# Patient Record
Sex: Female | Born: 1965 | Hispanic: Yes | Marital: Single | Smoking: Former smoker
Health system: Southern US, Community
[De-identification: ages and names within clinical notes are randomized; demographics above are authoritative.]

## PROBLEM LIST (undated history)

## (undated) DIAGNOSIS — I10 Essential (primary) hypertension: Secondary | ICD-10-CM

---

## 2014-09-21 ENCOUNTER — Encounter (HOSPITAL_BASED_OUTPATIENT_CLINIC_OR_DEPARTMENT_OTHER): Payer: Self-pay | Admitting: *Deleted

## 2014-09-21 ENCOUNTER — Emergency Department (HOSPITAL_BASED_OUTPATIENT_CLINIC_OR_DEPARTMENT_OTHER): Payer: Self-pay

## 2014-09-21 ENCOUNTER — Emergency Department (HOSPITAL_BASED_OUTPATIENT_CLINIC_OR_DEPARTMENT_OTHER)
Admission: EM | Admit: 2014-09-21 | Discharge: 2014-09-21 | Disposition: A | Discharge status: Home / Self Care | Payer: Self-pay | Attending: Emergency Medicine | Admitting: Emergency Medicine

## 2014-09-21 DIAGNOSIS — R200 Anesthesia of skin: Secondary | ICD-10-CM | POA: Insufficient documentation

## 2014-09-21 DIAGNOSIS — R0789 Other chest pain: Secondary | ICD-10-CM | POA: Insufficient documentation

## 2014-09-21 DIAGNOSIS — R0602 Shortness of breath: Secondary | ICD-10-CM | POA: Insufficient documentation

## 2014-09-21 DIAGNOSIS — I10 Essential (primary) hypertension: Secondary | ICD-10-CM | POA: Insufficient documentation

## 2014-09-21 DIAGNOSIS — R110 Nausea: Secondary | ICD-10-CM | POA: Insufficient documentation

## 2014-09-21 DIAGNOSIS — Z720 Tobacco use: Secondary | ICD-10-CM | POA: Insufficient documentation

## 2014-09-21 HISTORY — DX: Essential (primary) hypertension: I10

## 2014-09-21 LAB — BASIC METABOLIC PANEL
Anion gap: 8 (ref 5–15)
BUN: 10 mg/dL (ref 6–20)
CO2: 24 mmol/L (ref 22–32)
CREATININE: 0.5 mg/dL (ref 0.44–1.00)
Calcium: 8.7 mg/dL — ABNORMAL LOW (ref 8.9–10.3)
Chloride: 105 mmol/L (ref 101–111)
Glucose, Bld: 105 mg/dL — ABNORMAL HIGH (ref 65–99)
POTASSIUM: 3.4 mmol/L — AB (ref 3.5–5.1)
SODIUM: 137 mmol/L (ref 135–145)

## 2014-09-21 LAB — TROPONIN I
Troponin I: <0.03 ng/mL (ref <0.031)
Troponin I: <0.03 ng/mL (ref <0.031)

## 2014-09-21 LAB — CBC
HEMATOCRIT: 36.2 % (ref 36.0–46.0)
Hemoglobin: 12.2 g/dL (ref 12.0–15.0)
MCH: 27.1 pg (ref 26.0–34.0)
MCHC: 33.7 g/dL (ref 30.0–36.0)
MCV: 80.3 fL (ref 78.0–100.0)
Platelets: 244 10*3/uL (ref 150–400)
RBC: 4.51 MIL/uL (ref 3.87–5.11)
RDW: 16 % — ABNORMAL HIGH (ref 11.5–15.5)
WBC: 8.3 10*3/uL (ref 4.0–10.5)

## 2014-09-21 MED ORDER — POTASSIUM CHLORIDE CRYS ER 20 MEQ PO TBCR
40.0000 meq | EXTENDED_RELEASE_TABLET | Freq: Once | ORAL | Status: AC
  Administered 2014-09-21: 40 meq via ORAL
  Filled 2014-09-21: qty 2

## 2014-09-21 NOTE — ED Provider Notes (Signed)
CSN: [id number]     Arrival date & time 09/21/14  1428 History   First MD Initiated Contact with Patient 09/21/14 1436     Chief Complaint  Patient presents with  . Chest Pain   History is obtained from professional medical interpreter using language line. Patient speaks no English  (Consider location/radiation/quality/duration/timing/severity/associated sxs/prior Treatment) HPI Complains of chest pain intermittent for the past 2 weeks accompanied by left arm and left leg numbness. Chest pain is made worse by lying on her right side improved with lifting small weights. She reports feeling short of breath and nauseated last night however now she is asymptomatic. Chest pain is nonexertional. Sharp in quality. At left anterior chest. Past Medical History  Diagnosis Date  . Hypertension     History reviewed. No pertinent past surgical history. History reviewed. No pertinent family history. History  Substance Use Topics  . Smoking status: Current Some Day Smoker  . Smokeless tobacco: Not on file  . Alcohol Use: No   denies drug use OB History    No data available     family history negative for coronary disease Review of Systems  Constitutional: Negative.   HENT: Negative.   Respiratory: Positive for shortness of breath.   Cardiovascular: Positive for chest pain.  Gastrointestinal: Positive for nausea.  Genitourinary: Negative.        Last normal menstrual period past 2 weeks ago  Musculoskeletal: Negative.   Skin: Negative.   Neurological: Positive for numbness.  Psychiatric/Behavioral: Negative.   All other systems reviewed and are negative.     Allergies  Review of patient's allergies indicates no known allergies.  Home Medications  Medication for weight loss, Prior to Admission medications   Not on File   There were no vitals taken for this visit. Physical Exam  Constitutional: She appears well-developed and well-nourished.  HENT:  Head: Normocephalic and  atraumatic.  Eyes: Conjunctivae are normal. Pupils are equal, round, and reactive to light.  Neck: Neck supple. No tracheal deviation present. No thyromegaly present.  Cardiovascular: Normal rate and regular rhythm.   No murmur heard. Pulmonary/Chest: Effort normal and breath sounds normal.  Abdominal: Soft. Bowel sounds are normal. She exhibits no distension. There is no tenderness.  Musculoskeletal: Normal range of motion. She exhibits no edema or tenderness.  Neurological: She is alert. Coordination normal.  Skin: Skin is warm and dry. No rash noted.  Psychiatric: She has a normal mood and affect.  Nursing note and vitals reviewed.   ED Course  Procedures (including critical care time) Labs Review Labs Reviewed  BASIC METABOLIC PANEL  CBC  TROPONIN I    Imaging Review No results found.   EKG Interpretation None      Date: 09/21/2014  Rate: 70  Rhythm: normal sinus rhythm  QRS Axis: normal  Intervals: normal  ST/T Wave abnormalities: normal  Conduction Disutrbances: none  Narrative Interpretation: unremarkable     Chest x-ray viewed by me  4:20 PM patient remains asymptomatic. Results for orders placed or performed during the hospital encounter of 09/21/14  Basic metabolic panel  Result Value Ref Range   Sodium 137 135 - 145 mmol/L   Potassium 3.4 (L) 3.5 - 5.1 mmol/L   Chloride 105 101 - 111 mmol/L   CO2 24 22 - 32 mmol/L   Glucose, Bld 105 (H) 65 - 99 mg/dL   BUN 10 6 - 20 mg/dL   Creatinine, Ser 7.84 0.44 - 1.00 mg/dL   Calcium 8.7 (L) 8.9 -  10.3 mg/dL   GFR calc non Af Amer >60 >60 mL/min   GFR calc Af Amer >60 >60 mL/min   Anion gap 8 5 - 15  CBC  Result Value Ref Range   WBC 8.3 4.0 - 10.5 K/uL   RBC 4.51 3.87 - 5.11 MIL/uL   Hemoglobin 12.2 12.0 - 15.0 g/dL   HCT 69.6 29.5 - 28.4 %   MCV 80.3 78.0 - 100.0 fL   MCH 27.1 26.0 - 34.0 pg   MCHC 33.7 30.0 - 36.0 g/dL   RDW 13.2 (H) 44.0 - 10.2 %   Platelets 244 150 - 400 K/uL  Troponin I   Result Value Ref Range   Troponin I <0.03 <0.031 ng/mL   Dg Chest 2 View  09/21/2014   CLINICAL DATA:  Left arm tingling for 2 weeks. Left-sided chest pain since yesterday. Shortness of breath.  EXAM: CHEST  2 VIEW  COMPARISON:  None.  FINDINGS: The heart size and mediastinal contours are within normal limits. Both lungs are clear. The visualized skeletal structures are unremarkable.  IMPRESSION: No active cardiopulmonary disease.   Electronically Signed   By: Annia Belt M.D.   On: 09/21/2014 15:25    MDM  Patient signed out to Dr. Rubin Payor for 425 PM. Assuming patient's second troponin negative, heart score equals 2. Patient with 2 cardiac risk factors. Highly atypical story. Normal EKG. Final diagnoses:  None   I counseled patient for 5 minutes on smoking cessation. She'll be referred to [facility] Diagnoses #1 atypical chest pain #2 tobacco abuse #3 hypokalemia     Doug Sou, MD 09/21/14 7253

## 2014-09-21 NOTE — ED Notes (Signed)
Patient c/o L arm tingling for the past two weeks, face started tingling last night, L side chest pain started yesterday with SOB

## 2014-09-21 NOTE — ED Provider Notes (Signed)
  Physical Exam  BP 105/65 mmHg  Pulse 61  Temp(Src) 97.8 F (36.6 C) (Oral)  Resp 20  Ht  (1.626 m)  Wt 180 lb (81.647 kg)  BMI 30.88 kg/m2  SpO2 99%  LMP 09/09/2014  Physical Exam  ED Course  Procedures  MDM Second troponin negative. Will discharge home.      Benjiman Core, MD 09/21/14 [phone]

## 2014-09-21 NOTE — Discharge Instructions (Signed)
Dolor de pecho (no especfico) (Chest Pain (Nonspecific)) Call the [facility] community wellness [facility] to get established with a primary care physician. Ask your new primary care physician to help you to stop smoking. Suele ser difcil diagnosticar la causa del dolor de [place]. Siempre hay una posibilidad de que el dolor podra estar relacionado con algo grave, como un ataque al corazn o un cogulo sanguneo en los pulmones. Debe concurrir a las visitas de control con el mdico. CUIDADOS EN EL HOGAR  Si le dieron antibiticos, tmelos como se lo haya indicado el mdico. Finalice el medicamento, aunque comience a Actor.  [address], no haga actividades que provoquen dolor de [place]. Contine con las actividades fsicas como se lo haya indicado el mdico.  No use productos que contengan tabaco, que incluyen cigarrillos, tabaco para Theatre manager y Administrator, Civil Service.  Evite el consumo de alcohol.  Tome los medicamentos solamente como se lo haya indicado el mdico.  Siga las sugerencias del mdico en lo que respecta a ms pruebas, si el dolor de pecho no desaparece.  Concurra a todas las visitas que concert con el mdico. SOLICITE AYUDA SI:  El dolor de pecho no desaparece, incluso despus del tratamiento.  Tiene una erupcin cutnea con ampollas en el pecho.  Tiene fiebre. SOLICITE AYUDA DE INMEDIATO SI:   Aumenta el dolor de pecho o el dolor se irradia hacia el brazo, el cuello, la [place], la espalda o el vientre (abdomen).  Le falta el aire.  Tose ms de lo normal o tose con sangre.  Siente un dolor muy intenso en la espalda o el vientre.  Tiene malestar estomacal (nuseas) o vomita.  Se siente muy dbil.  Pierde el conocimiento (se desmaya).  Tiene escalofros. Esto es Radio broadcast assistant. No espere a ver que los problemas desaparezcan. Llame a los servicios de emergencia locales (911 en los [place]). No conduzca por sus propios medios  [facility]. ASEGRESE DE QUE:   Comprende estas instrucciones.  Controlar su afeccin.  Recibir ayuda de inmediato si no mejora o si empeora. Document Released: 04/29/2008 Document Revised: 02/05/2013 [facility] Patient Information 2015 [place], [place]. This information is not intended to replace advice given to you by your health care provider. Make sure you discuss any questions you have with your health care provider.

## 2015-11-09 ENCOUNTER — Emergency Department (HOSPITAL_BASED_OUTPATIENT_CLINIC_OR_DEPARTMENT_OTHER)
Admission: EM | Admit: 2015-11-09 | Discharge: 2015-11-09 | Disposition: A | Discharge status: Home / Self Care | Payer: Self-pay | Attending: Emergency Medicine | Admitting: Emergency Medicine

## 2015-11-09 ENCOUNTER — Encounter (HOSPITAL_BASED_OUTPATIENT_CLINIC_OR_DEPARTMENT_OTHER): Payer: Self-pay | Admitting: *Deleted

## 2015-11-09 ENCOUNTER — Emergency Department (HOSPITAL_BASED_OUTPATIENT_CLINIC_OR_DEPARTMENT_OTHER): Payer: Self-pay

## 2015-11-09 DIAGNOSIS — F17210 Nicotine dependence, cigarettes, uncomplicated: Secondary | ICD-10-CM | POA: Insufficient documentation

## 2015-11-09 DIAGNOSIS — R42 Dizziness and giddiness: Secondary | ICD-10-CM | POA: Insufficient documentation

## 2015-11-09 DIAGNOSIS — R0602 Shortness of breath: Secondary | ICD-10-CM | POA: Insufficient documentation

## 2015-11-09 DIAGNOSIS — I10 Essential (primary) hypertension: Secondary | ICD-10-CM | POA: Insufficient documentation

## 2015-11-09 LAB — CBC
HCT: 38.9 % (ref 36.0–46.0)
Hemoglobin: 13.1 g/dL (ref 12.0–15.0)
MCH: 27.8 pg (ref 26.0–34.0)
MCHC: 33.7 g/dL (ref 30.0–36.0)
MCV: 82.4 fL (ref 78.0–100.0)
PLATELETS: 257 10*3/uL (ref 150–400)
RBC: 4.72 MIL/uL (ref 3.87–5.11)
RDW: 13.4 % (ref 11.5–15.5)
WBC: 6.6 10*3/uL (ref 4.0–10.5)

## 2015-11-09 LAB — COMPREHENSIVE METABOLIC PANEL
ALBUMIN: 4.2 g/dL (ref 3.5–5.0)
ALK PHOS: 87 U/L (ref 38–126)
ALT: 16 U/L (ref 14–54)
AST: 16 U/L (ref 15–41)
Anion gap: 8 (ref 5–15)
BUN: 13 mg/dL (ref 6–20)
CALCIUM: 9 mg/dL (ref 8.9–10.3)
CO2: 23 mmol/L (ref 22–32)
Chloride: 106 mmol/L (ref 101–111)
Creatinine, Ser: 0.58 mg/dL (ref 0.44–1.00)
GFR calc Af Amer: >60 mL/min (ref >60)
GFR calc non Af Amer: >60 mL/min (ref >60)
GLUCOSE: 97 mg/dL (ref 65–99)
Potassium: 3.5 mmol/L (ref 3.5–5.1)
SODIUM: 137 mmol/L (ref 135–145)
Total Bilirubin: 0.6 mg/dL (ref 0.3–1.2)
Total Protein: 7 g/dL (ref 6.5–8.1)

## 2015-11-09 LAB — URINALYSIS, ROUTINE W REFLEX MICROSCOPIC
BILIRUBIN URINE: NEGATIVE
Glucose, UA: NEGATIVE mg/dL
HGB URINE DIPSTICK: NEGATIVE
KETONES UR: NEGATIVE mg/dL
Leukocytes, UA: NEGATIVE
Nitrite: NEGATIVE
PROTEIN: NEGATIVE mg/dL
Specific Gravity, Urine: 1.009 (ref 1.005–1.030)
pH: 5.5 (ref 5.0–8.0)

## 2015-11-09 LAB — TROPONIN I

## 2015-11-09 LAB — LIPASE, BLOOD: Lipase: 44 U/L (ref 11–51)

## 2015-11-09 MED ORDER — IOPAMIDOL (ISOVUE-370) INJECTION 76%
100.0000 mL | Freq: Once | INTRAVENOUS | Status: AC | PRN
  Administered 2015-11-09: 100 mL via INTRAVENOUS

## 2015-11-09 MED ORDER — MECLIZINE HCL 25 MG PO TABS: 25.0000 mg | ORAL_TABLET | Freq: Three times a day (TID) | ORAL | 0 refills | Status: DC | PRN

## 2015-11-09 MED ORDER — MECLIZINE HCL 25 MG PO TABS
25.0000 mg | ORAL_TABLET | Freq: Once | ORAL | Status: AC
  Administered 2015-11-09: 25 mg via ORAL
  Filled 2015-11-09: qty 1

## 2015-11-09 NOTE — ED Triage Notes (Signed)
Limited english.  Pt reports multiple complaints.  Abdominal pain with N/V, SOB, dizziness, lightheaded x 1 week.  Denies diarrhea.

## 2015-11-09 NOTE — ED Provider Notes (Signed)
[facility]-EMERGENCY DEPT Provider Note   CSN: [id number] Arrival date & time: 11/09/15  1234     History   Chief Complaint Chief Complaint  Patient presents with  . Shortness of Breath    HPI Kaitlin Alvarez is a 50 y.o. female with no significant past history who presents emergency Department with chief complaint of pleuritic chest pain. Patient was noticed that she states has swelling in the left leg for about one month. She is to be worse at night. She denies any pain, redness. She has never had that previously. One week ago she developed pain in the left chest which she describes as pleuritic, nonexertional, constant with associated shortness of breath. She denies hemoptysis, history of DVT, oral contraceptive use or recent confinement. She does smoke cigarettes. She denies fevers or chills.  HPI  Past Medical History:  Diagnosis Date  . Hypertension     There are no active problems to display for this patient.   History reviewed. No pertinent surgical history.  OB History    No data available       Home Medications    Prior to Admission medications   Medication Sig Start Date End Date Taking? Authorizing Provider  meclizine (ANTIVERT) 25 MG tablet Take 1 tablet (25 mg total) by mouth 3 (three) times daily as needed for dizziness. 11/09/15   Eyvonne MechanicJeffrey Hedges, PA-C    Family History History reviewed. No pertinent family history.  Social History Social History  Substance Use Topics  . Smoking status: Current Some Day Smoker    Packs/day: 0.50    Types: Cigarettes  . Smokeless tobacco: Never Used  . Alcohol use No   heart score equals 2. Patient with 2 cardiac risk factors. Highly atypical story. Normal EKG.   Allergies   Review of patient's allergies indicates no known allergies.   Review of Systems Review of Systems  Ten systems reviewed and are negative for acute change, except as noted in the HPI.   Physical Exam Updated Vital Signs BP 125/86   Pulse  65   Temp 98 F (36.7 C) (Oral)   Resp 20   Ht 5\' 4"  (1.626 m)   Wt 79.4 kg   LMP 10/09/2015   SpO2 99%   BMI 30.04 kg/m   Physical Exam  Constitutional: She is oriented to person, place, and time. She appears well-developed and well-nourished. No distress.  HENT:  Head: Normocephalic and atraumatic.  Eyes: Conjunctivae are normal. No scleral icterus.  Neck: Normal range of motion.  Cardiovascular: Normal rate, regular rhythm, normal heart sounds and intact distal pulses.  Exam reveals no gallop and no friction rub.   No murmur heard. No appreciable swelling in one leg versus the other Significant telangiectasia of the bilateral lower extremities  Pulmonary/Chest: Effort normal and breath sounds normal. No respiratory distress. She exhibits no tenderness.  Abdominal: Soft. Bowel sounds are normal. She exhibits no distension and no mass. There is no tenderness. There is no guarding.  Neurological: She is alert and oriented to person, place, and time.  Skin: Skin is warm and dry. She is not diaphoretic.  Nursing note and vitals reviewed.    ED Treatments / Results  Labs (all labs ordered are listed, but only abnormal results are displayed) Labs Reviewed  LIPASE, BLOOD  COMPREHENSIVE METABOLIC PANEL  CBC  URINALYSIS, ROUTINE W REFLEX MICROSCOPIC (NOT [facility])  TROPONIN I    EKG  EKG Interpretation  Date/Time:  Monday November 09 2015 13:07:09 EDT  Ventricular Rate:  87 PR Interval:  160 QRS Duration: 84 QT Interval:  376 QTC Calculation: 452 R Axis:   127 Text Interpretation:  Normal sinus rhythm No significant change since last tracing Confirmed by Erroll Luna [phone]) on 11/09/2015 11:51:06 PM       Radiology Ct Angio Chest Pe W And/or Wo Contrast  Result Date: 11/09/2015 CLINICAL DATA:  Cough, shortness of breath for 1 week EXAM: CT ANGIOGRAPHY CHEST WITH CONTRAST TECHNIQUE: Multidetector CT imaging of the chest was performed using the standard  protocol during bolus administration of intravenous contrast. Multiplanar CT image reconstructions and MIPs were obtained to evaluate the vascular anatomy. CONTRAST:  100 cc Isovue COMPARISON:  None. FINDINGS: Cardiovascular: The study is of excellent technical quality. No pulmonary embolus is noted. There is no aortic aneurysm or aortic dissection. Heart size within normal limits. No pericardial effusion. Mediastinum/Nodes: No mediastinal hematoma or adenopathy. Lungs/Pleura: Images of the lung parenchyma shows no acute infiltrate or pulmonary edema. There is short segment thickening of minor fissure in right upper lobe best seen in sagittal image 42 measures 1.3 cm in length by 3 mm thickness. There is 5 mm nodule in right middle lobe laterally. The left lung is clear. No pneumothorax. Upper Abdomen: The visualized upper abdomen shows no adrenal gland mass. Musculoskeletal: Sagittal images of the spine shows degenerative changes mid thoracic spine. Sagittal view of the sternum is unremarkable. Review of the MIP images confirms the above findings. IMPRESSION: 1. No pulmonary embolus is noted.  No aortic dissection or aneurysm. 2. No mediastinal hematoma or adenopathy. 3. There is a 5 mm nodule in right middle lobe. Nonspecific mild thickening of minor fissure in right upper lobe. No follow-up needed if patient is low-risk. Non-contrast chest CT can be considered in 12 months if patient is high-risk. This recommendation follows the consensus statement: Guidelines for Management of Incidental Pulmonary Nodules Detected on CT Images: From the Fleischner Society 2017; Radiology 2017; 284:228-243. 4. No acute infiltrate or pulmonary edema. Mild degenerative changes mid thoracic spine. Electronically Signed   By: Natasha Mead M.D.   On: 11/09/2015 17:33    Procedures Procedures (including critical care time)  Medications Ordered in ED Medications  iopamidol (ISOVUE-370) 76 % injection 100 mL (100 mLs Intravenous  Contrast Given 11/09/15 1711)  meclizine (ANTIVERT) tablet 25 mg (25 mg Oral Given 11/09/15 1947)     Initial Impression / Assessment and Plan / ED Course  I have reviewed the triage vital signs and the nursing notes.  Pertinent labs & imaging results that were available during my care of the patient were reviewed by me and considered in my medical decision making (see chart for details).  Clinical Course    Patient with a heart score of 2, negative troponin, EKG and lab work is negative. Her chest x-ray is also without acute abnormality. Patient is Wells moderate risk by my calculation, and therefore I feel she will need a CT imaging to rule out pulmonary embolus. I have given report to PA Hedges who will assume care of the patient. The patient is aware of the plan of care and stable throughout visit.bea  Final Clinical Impressions(s) / ED Diagnoses   Final diagnoses:  SOB (shortness of breath)  Vertigo    New Prescriptions Discharge Medication List as of 11/09/2015  6:36 PM    START taking these medications   Details  meclizine (ANTIVERT) 25 MG tablet Take 1 tablet (25 mg total) by mouth 3 (three)  times daily as needed for dizziness., Starting Mon 11/09/2015, Print         [place], PA-C 11/11/15 1535    Nelva Nay, MD 11/13/15 [phone]

## 2015-11-09 NOTE — Discharge Instructions (Signed)
Please contact her primary care provider tomorrow and inform him of today's visit and all relevant data.  Please follow-up within 1-2 days for reevaluation.  Please inform them of the findings on CT scan and need for follow-up evaluation.  Please return to emergency room immediately if you have dispense any new or worsening signs or symptoms

## 2015-11-09 NOTE — ED Notes (Signed)
Patient transported to CT 

## 2015-11-09 NOTE — ED Provider Notes (Signed)
50 -year-old female signed out to me at shift change by oncoming provider pending CT Marylene Landngela.  Please see previous progress note for full H&P.  Patient with pleuritic right-sided chest pain and intermittent swelling to the left lower leg.  Concern for PE in this patient, CT Marylene Landngela shows no signs of PE, patient has very reassuring vital signs with oxygen 100 heart rate 68 respirations 20.  Patient has no rash, clear lung sounds.  Incidental finding of a nodule, patient is a smoker, she will need repeat follow-up in 1 year, results will be printed for patient and daughter assures follow-up evaluation.  Patient also notes that for the last 4 days she's also had intermittent dizziness.  She notes this is worse with head to head movements, looking side to side, not exertional, with no associated neurological deficits.  Patient has no signs or symptoms of intracranial abnormality, symptoms most consistent with peripheral vertigo.  Patient will be given a course of meclizine, with close follow-up with her primary care provider.  She is informed to contact them first thing tomorrow to schedule follow-up evaluation in the next 1-2 days.  She is given strict preterm percussions, she verbalized understanding and agreement to today's plan had no further questions or concerns and some discharge.  Patient's daughter was used as Nurse, learning disabilitytranslator request.  Vitals:   11/09/15 1502 11/09/15 1720  BP: 114/79 133/84  Pulse: 66 68  Resp: 18 20  Temp:     IMPRESSION: 1. No pulmonary embolus is noted. No aortic dissection or aneurysm. 2. No mediastinal hematoma or adenopathy. 3. There is a 5 mm nodule in right middle lobe. Nonspecific mild thickening of minor fissure in right upper lobe. No follow-up needed if patient is low-risk. Non-contrast chest CT can be considered in 12 months if patient is high-risk. This recommendation follows the consensus statement: Guidelines for Management of Incidental Pulmonary Nodules Detected  on CT Images: From the Fleischner Society 2017; Radiology 2017; 284:228-243. 4. No acute infiltrate or pulmonary edema. Mild degenerative changes mid thoracic spine.   Eyvonne MechanicJeffrey Estelle [place], PA-C 11/09/15 [id number]    Tomasita CrumbleAdeleke Oni, MD 11/09/15 2351

## 2015-11-09 NOTE — ED Notes (Addendum)
Pt reports dizziness, chest pain, SOB, abd pain, and nausea x 3 days. Pt states it feels like the room is spinning when she gets up. Pt states the intermittent chest pain feels like a tightness and does not radiate.

## 2016-08-08 ENCOUNTER — Encounter (HOSPITAL_BASED_OUTPATIENT_CLINIC_OR_DEPARTMENT_OTHER): Payer: Self-pay | Admitting: *Deleted

## 2016-08-08 DIAGNOSIS — Z87891 Personal history of nicotine dependence: Secondary | ICD-10-CM | POA: Insufficient documentation

## 2016-08-08 DIAGNOSIS — I10 Essential (primary) hypertension: Secondary | ICD-10-CM | POA: Insufficient documentation

## 2016-08-08 DIAGNOSIS — N10 Acute tubulo-interstitial nephritis: Secondary | ICD-10-CM | POA: Insufficient documentation

## 2016-08-08 LAB — URINALYSIS, MICROSCOPIC (REFLEX)

## 2016-08-08 LAB — URINALYSIS, ROUTINE W REFLEX MICROSCOPIC
Bilirubin Urine: NEGATIVE
GLUCOSE, UA: NEGATIVE mg/dL
Ketones, ur: NEGATIVE mg/dL
Nitrite: NEGATIVE
PH: 5.5 (ref 5.0–8.0)
Protein, ur: NEGATIVE mg/dL
SPECIFIC GRAVITY, URINE: 1.017 (ref 1.005–1.030)

## 2016-08-08 NOTE — ED Triage Notes (Signed)
Pt c/o sudden onset of left flank pain x 8 hrs ago

## 2016-08-09 ENCOUNTER — Emergency Department (HOSPITAL_BASED_OUTPATIENT_CLINIC_OR_DEPARTMENT_OTHER)
Admission: EM | Admit: 2016-08-09 | Discharge: 2016-08-09 | Disposition: A | Discharge status: Home / Self Care | Payer: Self-pay | Attending: Emergency Medicine | Admitting: Emergency Medicine

## 2016-08-09 DIAGNOSIS — N12 Tubulo-interstitial nephritis, not specified as acute or chronic: Secondary | ICD-10-CM

## 2016-08-09 LAB — CBC WITH DIFFERENTIAL/PLATELET
BASOS PCT: 0 %
Basophils Absolute: 0 10*3/uL (ref 0.0–0.1)
Eosinophils Absolute: 0.3 10*3/uL (ref 0.0–0.7)
Eosinophils Relative: 3 %
HCT: 36.8 % (ref 36.0–46.0)
Hemoglobin: 12.6 g/dL (ref 12.0–15.0)
LYMPHS ABS: 2.9 10*3/uL (ref 0.7–4.0)
Lymphocytes Relative: 33 %
MCH: 27.9 pg (ref 26.0–34.0)
MCHC: 34.2 g/dL (ref 30.0–36.0)
MCV: 81.6 fL (ref 78.0–100.0)
MONO ABS: 0.7 10*3/uL (ref 0.1–1.0)
MONOS PCT: 8 %
Neutro Abs: 4.9 10*3/uL (ref 1.7–7.7)
Neutrophils Relative %: 56 %
Platelets: 267 10*3/uL (ref 150–400)
RBC: 4.51 MIL/uL (ref 3.87–5.11)
RDW: 13.8 % (ref 11.5–15.5)
WBC: 8.8 10*3/uL (ref 4.0–10.5)

## 2016-08-09 LAB — BASIC METABOLIC PANEL
Anion gap: 6 (ref 5–15)
BUN: 11 mg/dL (ref 6–20)
CO2: 26 mmol/L (ref 22–32)
CREATININE: 0.59 mg/dL (ref 0.44–1.00)
Calcium: 8.9 mg/dL (ref 8.9–10.3)
Chloride: 107 mmol/L (ref 101–111)
GFR calc non Af Amer: >60 mL/min (ref >60)
GLUCOSE: 95 mg/dL (ref 65–99)
Potassium: 3.5 mmol/L (ref 3.5–5.1)
Sodium: 139 mmol/L (ref 135–145)

## 2016-08-09 MED ORDER — DEXTROSE 5 % IV SOLN
1.0000 g | Freq: Once | INTRAVENOUS | Status: AC
  Administered 2016-08-09: 1 g via INTRAVENOUS
  Filled 2016-08-09: qty 10

## 2016-08-09 MED ORDER — ONDANSETRON HCL 4 MG/2ML IJ SOLN
4.0000 mg | Freq: Once | INTRAMUSCULAR | Status: AC
  Administered 2016-08-09: 4 mg via INTRAVENOUS
  Filled 2016-08-09: qty 2

## 2016-08-09 MED ORDER — CEPHALEXIN 500 MG PO CAPS: 500.0000 mg | ORAL_CAPSULE | Freq: Four times a day (QID) | ORAL | 0 refills | Status: DC

## 2016-08-09 MED ORDER — KETOROLAC TROMETHAMINE 30 MG/ML IJ SOLN
30.0000 mg | Freq: Once | INTRAMUSCULAR | Status: AC
  Administered 2016-08-09: 30 mg via INTRAVENOUS
  Filled 2016-08-09: qty 1

## 2016-08-09 NOTE — ED Provider Notes (Signed)
[facility]   CSN: [id number] Arrival date & time: 08/08/16  2312     History   Chief Complaint Chief Complaint  Patient presents with  . Flank Pain    HPI Kaitlin Alvarez is a 51 y.o. female.  The history is provided by the patient. A language interpreter was used ([id number] - spanish).  Flank Pain  This is a new problem. The current episode started 6 to 12 hours ago. The problem occurs constantly. The problem has been gradually worsening. Associated symptoms include abdominal pain and shortness of breath. Exacerbated by: palpation. The symptoms are relieved by rest.   Pt reports recent dysuria and difficulty urinating over past several days Yesterday she began having severe left flank pain that radiates to left lower abdomen She has nausea No fever/vomiting/diarrhea Due to severe flank pain she has had SOB  Past Medical History:  Diagnosis Date  . Hypertension     There are no active problems to display for this patient.   History reviewed. No pertinent surgical history.  OB History    No data available       Home Medications    Prior to Admission medications   Medication Sig Start Date End Date Taking? Authorizing Provider  meclizine (ANTIVERT) 25 MG tablet Take 1 tablet (25 mg total) by mouth 3 (three) times daily as needed for dizziness. 11/09/15   Eyvonne Mechanic, PA-C    Family History History reviewed. No pertinent family history.  Social History Social History  Substance Use Topics  . Smoking status: Former Smoker    Packs/day: 0.50    Types: Cigarettes  . Smokeless tobacco: Never Used  . Alcohol use No     Allergies   Patient has no known allergies.   Review of Systems Review of Systems  Constitutional: Negative for fever.  Respiratory: Positive for shortness of breath.   Gastrointestinal: Positive for abdominal pain.  Genitourinary: Positive for difficulty urinating, dysuria and flank pain.  All other systems  reviewed and are negative.    Physical Exam Updated Vital Signs BP 131/81   Pulse 75   Temp 98.3 F (36.8 C)   Resp 18   Ht 1.676 m (5\' 6" )   Wt 90.7 kg (200 lb)   SpO2 100%   BMI 32.28 kg/m   Physical Exam CONSTITUTIONAL: Well developed/well nourished, uncomfortable apppearing HEAD: Normocephalic/atraumatic EYES: EOMI/PERRL ENMT: Mucous membranes moist NECK: supple no meningeal signs SPINE/BACK:entire spine nontender CV: S1/S2 noted, no murmurs/rubs/gallops noted LUNGS: Lungs are clear to auscultation bilaterally, no apparent distress ABDOMEN: soft, mild LLQ tenderness, no rebound or guarding, bowel sounds noted throughout abdomen OZ:HYQM cva tenderness NEURO: Pt is awake/alert/appropriate, moves all extremitiesx4.  No facial droop.   EXTREMITIES: pulses normal/equal, full ROM SKIN: warm, color normal PSYCH: no abnormalities of mood noted, alert and oriented to situation   ED Treatments / Results  Labs (all labs ordered are listed, but only abnormal results are displayed) Labs Reviewed  URINALYSIS, ROUTINE W REFLEX MICROSCOPIC - Abnormal; Notable for the following:       Result Value   APPearance CLOUDY (*)    Hgb urine dipstick SMALL (*)    Leukocytes, UA MODERATE (*)    All other components within normal limits  URINALYSIS, MICROSCOPIC (REFLEX) - Abnormal; Notable for the following:    Bacteria, UA MANY (*)    Squamous Epithelial / LPF 6-30 (*)    All other components within normal limits  CBC WITH DIFFERENTIAL/PLATELET  BASIC  METABOLIC PANEL    EKG  EKG Interpretation None       Radiology No results found.  Procedures Procedures (including critical care time)  Medications Ordered in ED Medications  ondansetron (ZOFRAN) injection 4 mg (4 mg Intravenous Given 08/09/16 0306)  ketorolac (TORADOL) 30 MG/ML injection 30 mg (30 mg Intravenous Given 08/09/16 0306)  cefTRIAXone (ROCEPHIN) 1 g in dextrose 5 % 50 mL IVPB (0 g Intravenous Stopped 08/09/16  0334)     Initial Impression / Assessment and Plan / ED Course  I have reviewed the triage vital signs and the nursing notes.  Pertinent labs  results that were available during my care of the patient were reviewed by me and considered in my medical decision making (see chart for details).     3:14 AM Pt stable This is likely PYELO Will treat and reassess    After meds, pt improved She is nontoxic, well appearing, watching TV Will d/c home with keflex for PYELO  Final Clinical Impressions(s) / ED Diagnoses   Final diagnoses:  Pyelonephritis    New Prescriptions Discharge Medication List as of 08/09/2016  4:00 AM    START taking these medications   Details  cephALEXin (KEFLEX) 500 MG capsule Take 1 capsule (500 mg total) by mouth 4 (four) times daily., Starting Tue 08/09/2016, Print         Zadie RhineWickline, Cristian Davitt, MD 08/09/16 [phone]

## 2016-08-09 NOTE — ED Notes (Signed)
EDP into room 

## 2016-08-09 NOTE — ED Notes (Signed)
Tolerating PO fluids °

## 2016-08-09 NOTE — ED Notes (Signed)
Alert, NAD, calm, interactive, resps e/u, speaking in clear complete sentences, no dyspnea noted, skin W&D, VSS, c/o L sided abd pain and L flank pain, rates 9/10, also dysuria, frequency, and urgency, (denies: sob, NVD, fever, itching, or dizziness). [facility]  [facility]2 sons/ minors[facility]. Older son translating. Translator services offered. Denies h/o kidney stones. H/o similar on the right.

## 2017-01-09 ENCOUNTER — Encounter (HOSPITAL_BASED_OUTPATIENT_CLINIC_OR_DEPARTMENT_OTHER): Payer: Self-pay | Admitting: *Deleted

## 2017-01-09 ENCOUNTER — Emergency Department (HOSPITAL_BASED_OUTPATIENT_CLINIC_OR_DEPARTMENT_OTHER)
Admission: EM | Admit: 2017-01-09 | Discharge: 2017-01-09 | Disposition: A | Discharge status: Home / Self Care | Payer: Self-pay | Attending: Emergency Medicine | Admitting: Emergency Medicine

## 2017-01-09 ENCOUNTER — Other Ambulatory Visit: Payer: Self-pay

## 2017-01-09 ENCOUNTER — Emergency Department (HOSPITAL_BASED_OUTPATIENT_CLINIC_OR_DEPARTMENT_OTHER): Payer: Self-pay

## 2017-01-09 DIAGNOSIS — I10 Essential (primary) hypertension: Secondary | ICD-10-CM | POA: Insufficient documentation

## 2017-01-09 DIAGNOSIS — R0789 Other chest pain: Secondary | ICD-10-CM | POA: Insufficient documentation

## 2017-01-09 DIAGNOSIS — Z87891 Personal history of nicotine dependence: Secondary | ICD-10-CM | POA: Insufficient documentation

## 2017-01-09 LAB — COMPREHENSIVE METABOLIC PANEL
ALBUMIN: 3.8 g/dL (ref 3.5–5.0)
ALK PHOS: 119 U/L (ref 38–126)
ALT: 22 U/L (ref 14–54)
ANION GAP: 5 (ref 5–15)
AST: 23 U/L (ref 15–41)
BUN: 9 mg/dL (ref 6–20)
CALCIUM: 8.7 mg/dL — AB (ref 8.9–10.3)
CHLORIDE: 103 mmol/L (ref 101–111)
CO2: 27 mmol/L (ref 22–32)
CREATININE: 0.56 mg/dL (ref 0.44–1.00)
GFR calc Af Amer: >60 mL/min (ref >60)
GFR calc non Af Amer: >60 mL/min (ref >60)
Glucose, Bld: 119 mg/dL — ABNORMAL HIGH (ref 65–99)
Potassium: 3.7 mmol/L (ref 3.5–5.1)
SODIUM: 135 mmol/L (ref 135–145)
Total Bilirubin: 1 mg/dL (ref 0.3–1.2)
Total Protein: 6.8 g/dL (ref 6.5–8.1)

## 2017-01-09 LAB — CBC
HCT: 39.9 % (ref 36.0–46.0)
HEMOGLOBIN: 13.7 g/dL (ref 12.0–15.0)
MCH: 27.7 pg (ref 26.0–34.0)
MCHC: 34.3 g/dL (ref 30.0–36.0)
MCV: 80.6 fL (ref 78.0–100.0)
Platelets: 280 10*3/uL (ref 150–400)
RBC: 4.95 MIL/uL (ref 3.87–5.11)
RDW: 14.1 % (ref 11.5–15.5)
WBC: 7.9 10*3/uL (ref 4.0–10.5)

## 2017-01-09 LAB — TROPONIN I: Troponin I: <0.03 ng/mL (ref <0.03)

## 2017-01-09 MED ORDER — ASPIRIN 81 MG PO CHEW
324.0000 mg | CHEWABLE_TABLET | Freq: Once | ORAL | Status: AC
  Administered 2017-01-09: 324 mg via ORAL
  Filled 2017-01-09: qty 4

## 2017-01-09 MED ORDER — ACETAMINOPHEN 500 MG PO TABS
1000.0000 mg | ORAL_TABLET | Freq: Once | ORAL | Status: AC
  Administered 2017-01-09: 1000 mg via ORAL
  Filled 2017-01-09: qty 2

## 2017-01-09 NOTE — ED Provider Notes (Addendum)
[facility] EMERGENCY DEPARTMENT Provider Note  CSN: [id number] Arrival date & time: 01/09/17 1323  Chief Complaint(s) Chest Pain  HPI Kaitlin KnudsenMaria Alvarez is a 51 y.o. female   The history is provided by the patient.  Chest Pain   This is a recurrent problem. Episode onset: 3 days. Episode frequency: intermittent. The problem has not changed since onset.The pain is associated with exertion and movement. The pain is present in the lateral region (left). The pain is moderate. The quality of the pain is described as sharp. The pain radiates to the left neck, left shoulder and left arm. Episode Length: several minutes. The symptoms are aggravated by certain positions and exertion. Associated symptoms include headaches, lower extremity edema, nausea and shortness of breath. Pertinent negatives include no abdominal pain, no cough, no fever, no leg pain, no malaise/fatigue and no vomiting. Risk factors include obesity.  Her past medical history is significant for hypertension.  Pertinent negatives for past medical history include no CAD, no diabetes, no DVT, no hyperlipidemia, no MI, no PE and no strokes.    Past Medical History Past Medical History:  Diagnosis Date  . Hypertension    There are no active problems to display for this patient.  Home Medication(s) Prior to Admission medications   Not on File                                                                                                                                    Past Surgical History History reviewed. No pertinent surgical history. Family History History reviewed. No pertinent family history.  Social History Social History   Tobacco Use  . Smoking status: Former Smoker    Packs/day: 0.50    Types: Cigarettes  . Smokeless tobacco: Never Used  Substance Use Topics  . Alcohol use: No  . Drug use: No   Allergies Patient has no known allergies.  Review of Systems Review of Systems  Constitutional:  Negative for fever and malaise/fatigue.  Respiratory: Positive for shortness of breath. Negative for cough.   Cardiovascular: Positive for chest pain.  Gastrointestinal: Positive for nausea. Negative for abdominal pain and vomiting.  Neurological: Positive for headaches.   All other systems are reviewed and are negative for acute change except as noted in the HPI  Physical Exam Vital Signs  I have reviewed the triage vital signs BP 130/90 (BP Location: Left Arm)   Pulse 93   Temp 98.7 F (37.1 C)   Resp 16   Ht 5\' 4"  (1.626 m)   Wt 93.9 kg (207 lb)   SpO2 98%   BMI 35.53 kg/m   Physical Exam  Constitutional: She is oriented to person, place, and time. She appears well-developed and well-nourished. No distress.  HENT:  Head: Normocephalic and atraumatic.  Nose: Nose normal.  Eyes: Conjunctivae and EOM are normal. Pupils are equal, round, and reactive to light. Right eye exhibits no discharge.  Left eye exhibits no discharge. No scleral icterus.  Neck: Normal range of motion. Neck supple.  Cardiovascular: Normal rate and regular rhythm. Exam reveals no gallop and no friction rub.  No murmur heard. Pulmonary/Chest: Effort normal and breath sounds normal. No stridor. No respiratory distress. She has no rales. She exhibits tenderness.    Abdominal: Soft. She exhibits no distension. There is no tenderness.  Musculoskeletal: She exhibits no edema.       Left shoulder: She exhibits tenderness and spasm. She exhibits no bony tenderness.       Arms: Neurological: She is alert and oriented to person, place, and time.  Skin: Skin is warm and dry. No rash noted. She is not diaphoretic. No erythema.  Psychiatric: She has a normal mood and affect.  Vitals reviewed.   ED Results and Treatments Labs (all labs ordered are listed, but only abnormal results are displayed) Labs Reviewed  COMPREHENSIVE METABOLIC PANEL - Abnormal; Notable for the following components:      Result Value    Glucose, Bld 119 (*)    Calcium 8.7 (*)    All other components within normal limits  TROPONIN I  CBC  TROPONIN I                                                                                                                         EKG  EKG Interpretation  Date/Time:  Monday January 09 2017 13:52:10 EST Ventricular Rate:  85 PR Interval:    QRS Duration: 95 QT Interval:  366 QTC Calculation: 436 R Axis:   115 Text Interpretation:  Sinus rhythm Right axis deviation No significant change since last tracing Confirmed by Drema Pryardama, Dwight Adamczak [phone]([id number]) on 01/09/2017 3:26:57 PM      Radiology Dg Chest 2 View  Result Date: 01/09/2017 CLINICAL DATA:  51 year old female with chest pain, left side neck pain, nausea and shortness of breath. EXAM: CHEST  2 VIEW COMPARISON:  Chest CTA 08/17/2016 and earlier. FINDINGS: Normal lung volumes. Normal cardiac size and mediastinal contours. Visualized tracheal air column is within normal limits. Lung parenchyma appears stable and clear aside from perhaps mild nonspecific chronic increased interstitial markings. Negative visible bowel gas pattern. No acute osseous abnormality identified. IMPRESSION: Negative.  No acute cardiopulmonary abnormality. Electronically Signed   By: Odessa FlemingH  Hall M.D.   On: 01/09/2017 13:44   Pertinent labs & imaging results that were available during my care of the patient were reviewed by me and considered in my medical decision making (see chart for details).  Medications Ordered in ED Medications  acetaminophen (TYLENOL) tablet 1,000 mg (1,000 mg Oral Given 01/09/17 1410)  aspirin chewable tablet 324 mg (324 mg Oral Given 01/09/17 1410)  Procedures Procedures  (including critical care time)  Medical Decision Making / ED Course I have reviewed the nursing notes for this encounter and the patient's  prior records (if available in EHR or on provided paperwork).    Atypical chest pain.  EKG without acute ischemic changes or evidence of pericarditis.  Initial troponin negative.  Heart score less than 4.  Patient is appropriate for delta troponin rest of the workup is unremarkable.  Low pretest probability for pulmonary embolism.  Presentation not classic for aortic dissection or esophageal perforation.  Chest x-ray without evidence suggestive of pneumonia, pneumothorax, pneumomediastinum.  No abnormal contour of the mediastinum to suggest dissection. No evidence of acute injuries.  Likely secondary from left shoulder girdle muscle muscle spasms, but will obtain delta trop to rule out ACS.  Delta trop negative.  Final diagnoses:  Atypical chest pain   Disposition: Discharge  Condition: Good  I have discussed the results, Dx and Tx plan with the patient who expressed understanding and agree(s) with the plan. Discharge instructions discussed at great length. The patient was given strict return precautions who verbalized understanding of the instructions. No further questions at time of discharge.    ED Discharge Orders    None       Follow Up: Jonny Ruiz, MD [address] [place] [address] [phone]  Schedule an appointment as soon as possible for a visit  As needed     This chart was dictated using voice recognition software.  Despite best efforts to proofread,  errors can occur which can change the documentation meaning.     Nira Conn, MD 01/09/17 1655

## 2017-01-09 NOTE — ED Notes (Signed)
Pt took to xray informed EMT Jonny RuizJohn and  Lequita HaltMorgan about EKG needed

## 2017-01-09 NOTE — ED Notes (Signed)
Pt is in xray, wil draw labs when returned to room.

## 2017-01-09 NOTE — Discharge Instructions (Signed)
Puede tomar Motrin (Ibuprofen) o Aleve (Naproxen), Acetaminophen (Tylenol), crema para los musculos como SalonPas, Icy Hot, Bengay, etc. Puede estrechar, ponerce hielo o comprecion de calor, o que le den masaje. ° °

## 2017-01-09 NOTE — ED Triage Notes (Signed)
Pt c/o left side chest pain, SOB with left jaw pain and arm numbness x 4 days,

## 2018-06-25 IMAGING — CT CT ANGIO CHEST
2 of 8 series · 18 of 36 positions shown · IV contrast (isovue)
Comparison: None.

CLINICAL DATA: Cough, shortness of breath for 1 week

EXAM:
CT ANGIOGRAPHY CHEST WITH CONTRAST
TECHNIQUE: Multidetector CT imaging of the chest was performed using the
standard protocol during bolus administration of intravenous
contrast. Multiplanar CT image reconstructions and MIPs were
obtained to evaluate the vascular anatomy.
CONTRAST:  100 cc Isovue

[Series 6: pe thins · axial · 0.72mm/px · z∈[+1049,+1313]mm · 17 of 296 slices shown]
[im 16/296  lung]
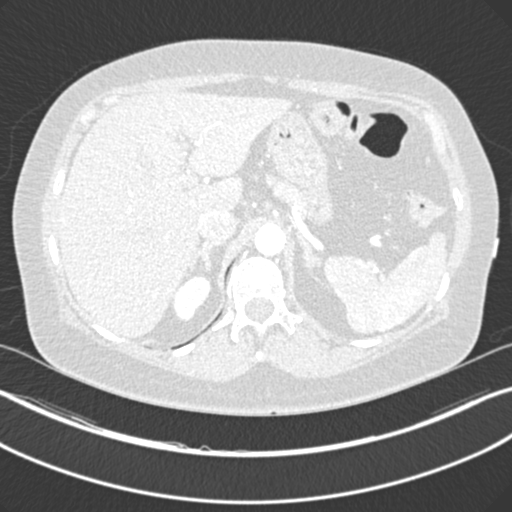
[im 32/296  mediastinal]
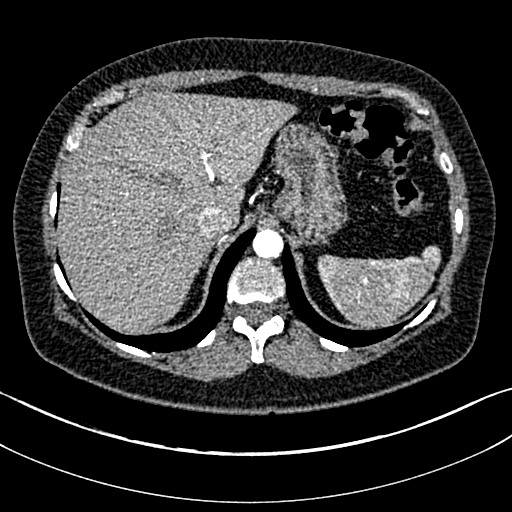
[im 47/296  lung]
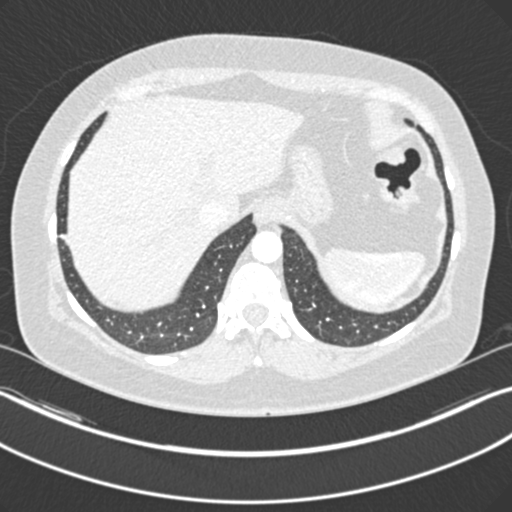
[im 63/296  mediastinal]
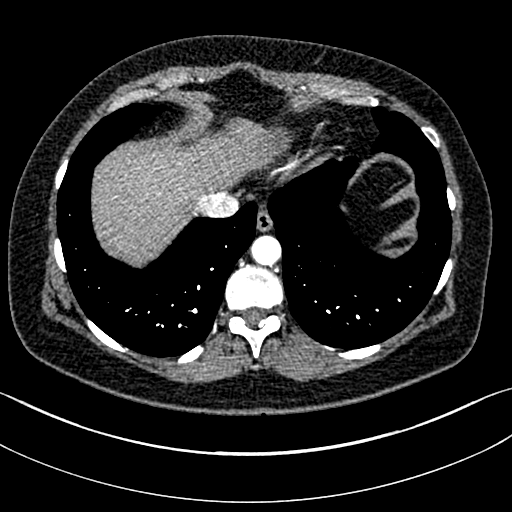
[im 78/296  lung]
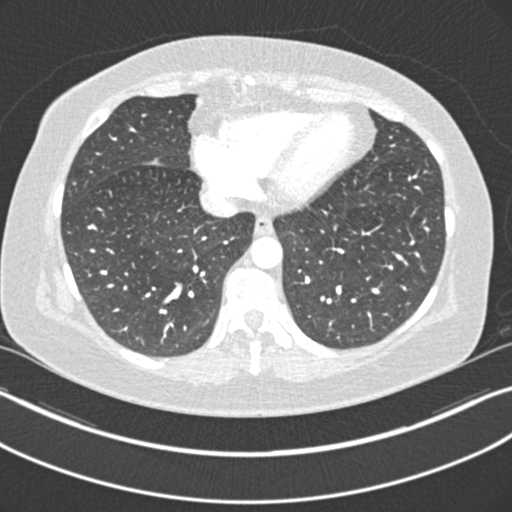
[im 94/296  mediastinal]
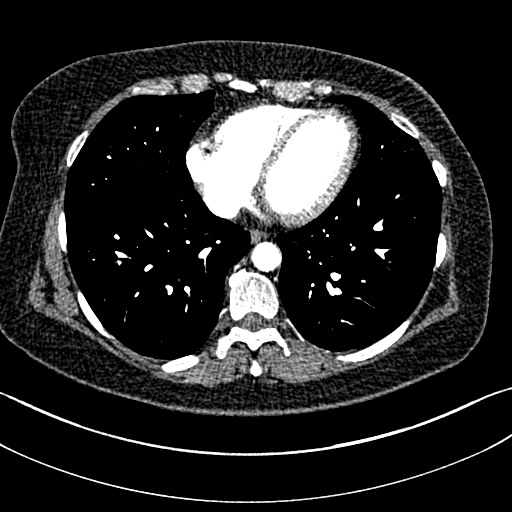
[im 109/296  lung]
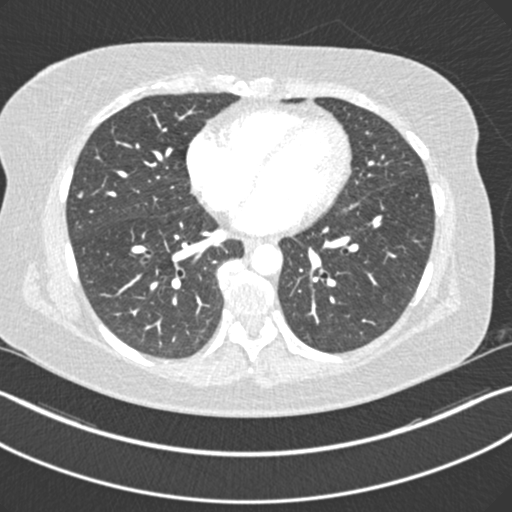
[im 125/296  mediastinal]
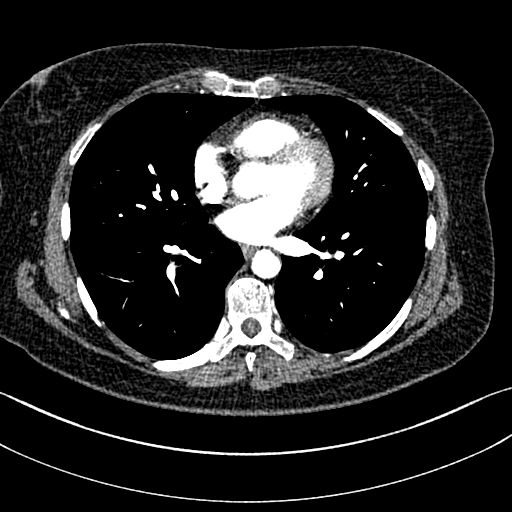
[im 156/296  lung]
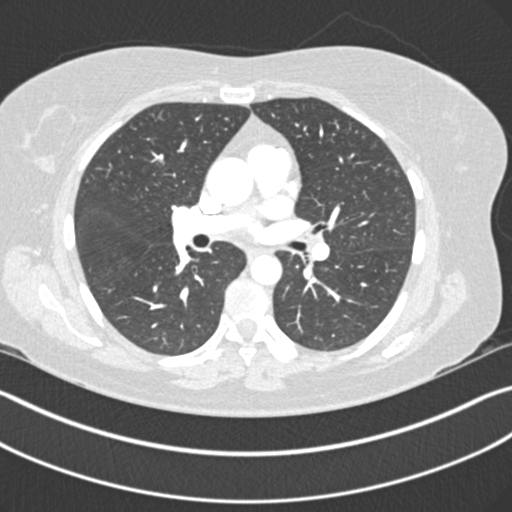
[im 171/296  mediastinal]
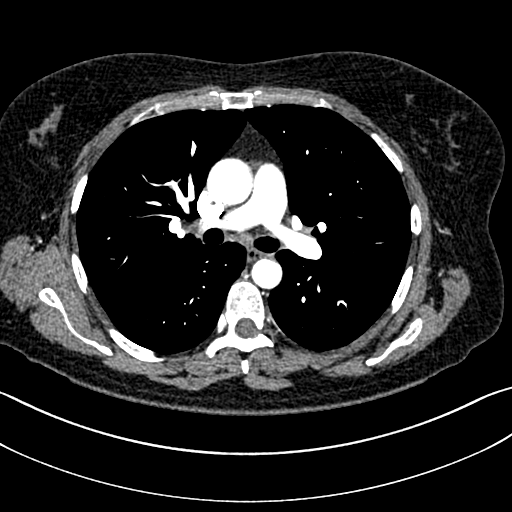
[im 187/296  lung]
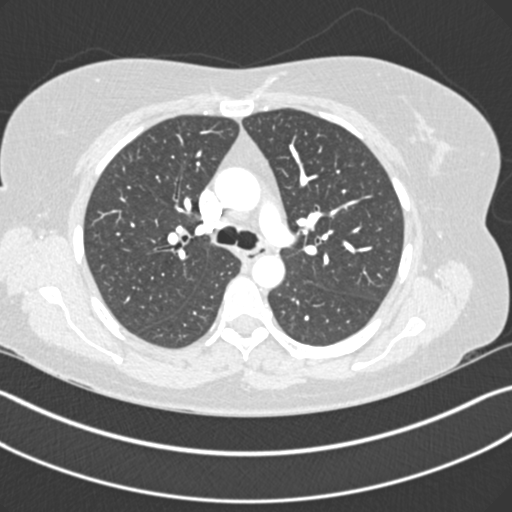
[im 202/296  mediastinal]
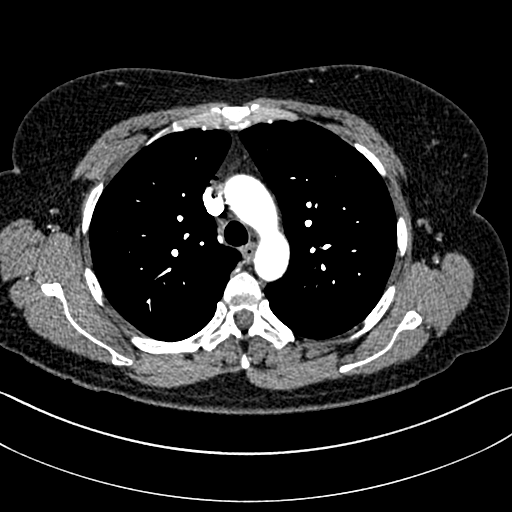
[im 218/296  lung]
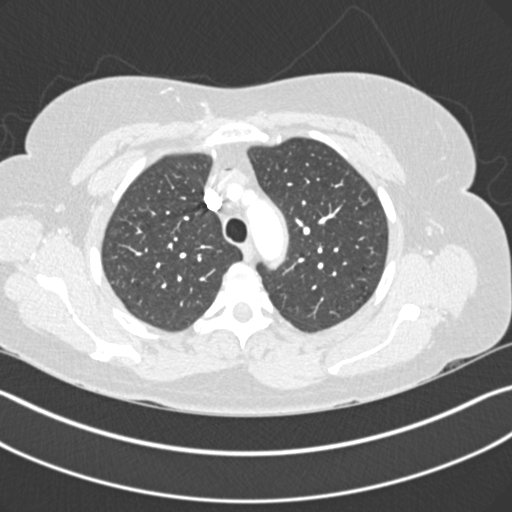
[im 233/296  mediastinal]
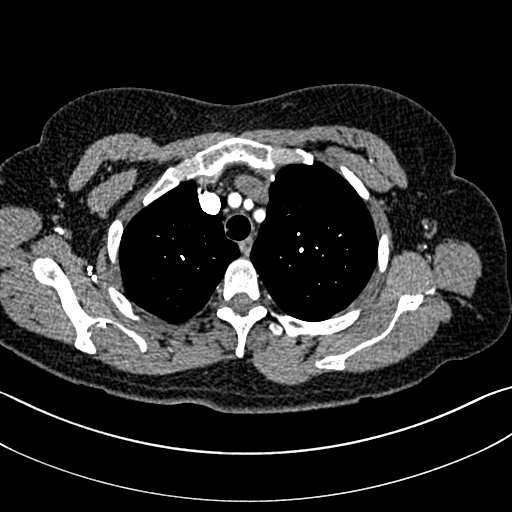
[im 249/296  lung]
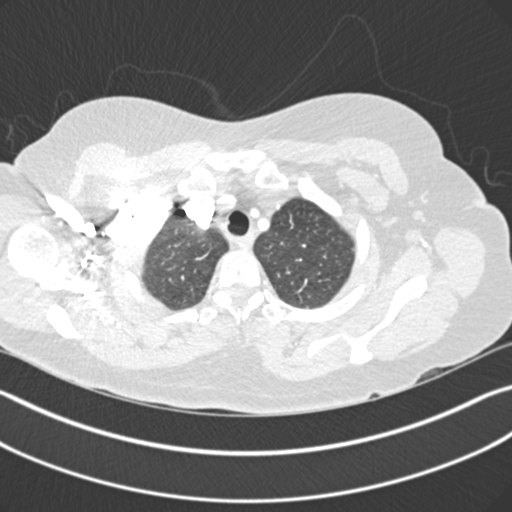
[im 264/296  mediastinal]
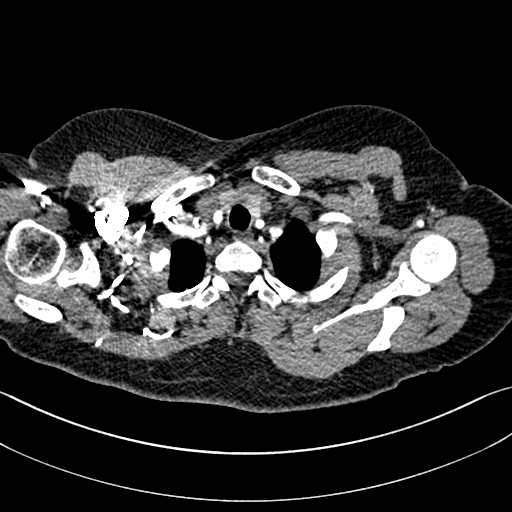
[im 280/296  lung]
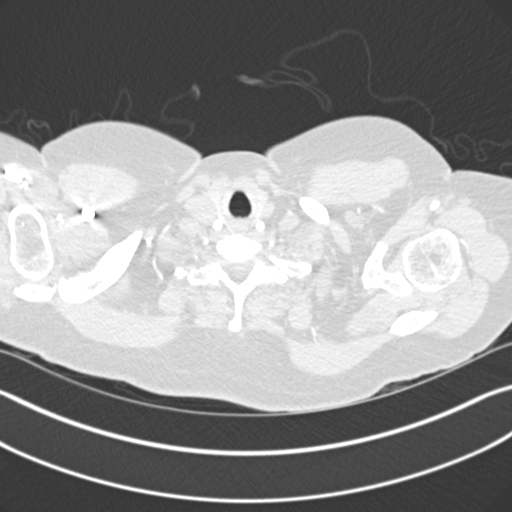

[Series 7: pe coronal mpr · coronal · 0.58mm/px · 1 of 125 slices shown]
[im 63/125  mediastinal]
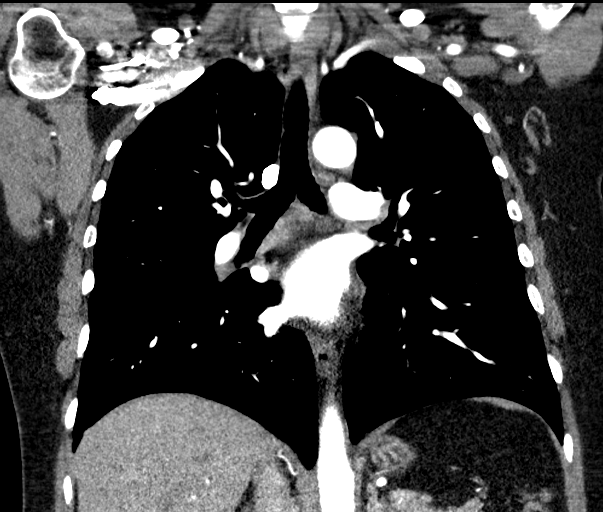

[18 of 36 positions shown; findings below may reference images not displayed]

FINDINGS: Cardiovascular: The study is of excellent technical quality. No
pulmonary embolus is noted. There is no aortic aneurysm or aortic
dissection. Heart size within normal limits. No pericardial
effusion.

Mediastinum/Nodes: No mediastinal hematoma or adenopathy.

Lungs/Pleura: Images of the lung parenchyma shows no acute
infiltrate or pulmonary edema. There is short segment thickening of
minor fissure in right upper lobe best seen in sagittal image 42
measures 1.3 cm in length by 3 mm thickness. There is 5 mm nodule in
right middle lobe laterally. The left lung is clear. No
pneumothorax.

Upper Abdomen: The visualized upper abdomen shows no adrenal gland
mass.

Musculoskeletal: Sagittal images of the spine shows degenerative
changes mid thoracic spine. Sagittal view of the sternum is
unremarkable.

Review of the MIP images confirms the above findings.
IMPRESSION: 1. No pulmonary embolus is noted.  No aortic dissection or aneurysm.
2. No mediastinal hematoma or adenopathy.
3. There is a 5 mm nodule in right middle lobe. Nonspecific mild
thickening of minor fissure in right upper lobe. No follow-up needed
if patient is low-risk. Non-contrast chest CT can be considered in
12 months if patient is high-risk. This recommendation follows the
consensus statement: Guidelines for Management of Incidental
Pulmonary Nodules Detected on CT Images: From the [HOSPITAL]
4. No acute infiltrate or pulmonary edema. Mild degenerative changes
mid thoracic spine.

## 2019-08-26 IMAGING — DX DG CHEST 2V
2 series · 2 of 2 positions shown · non-contrast
Comparison: Chest CTA 08/17/2016 and earlier.

CLINICAL DATA: 51-year-old female with chest pain, left side neck
pain, nausea and shortness of breath.

EXAM:
CHEST  2 VIEW

[chest pa]
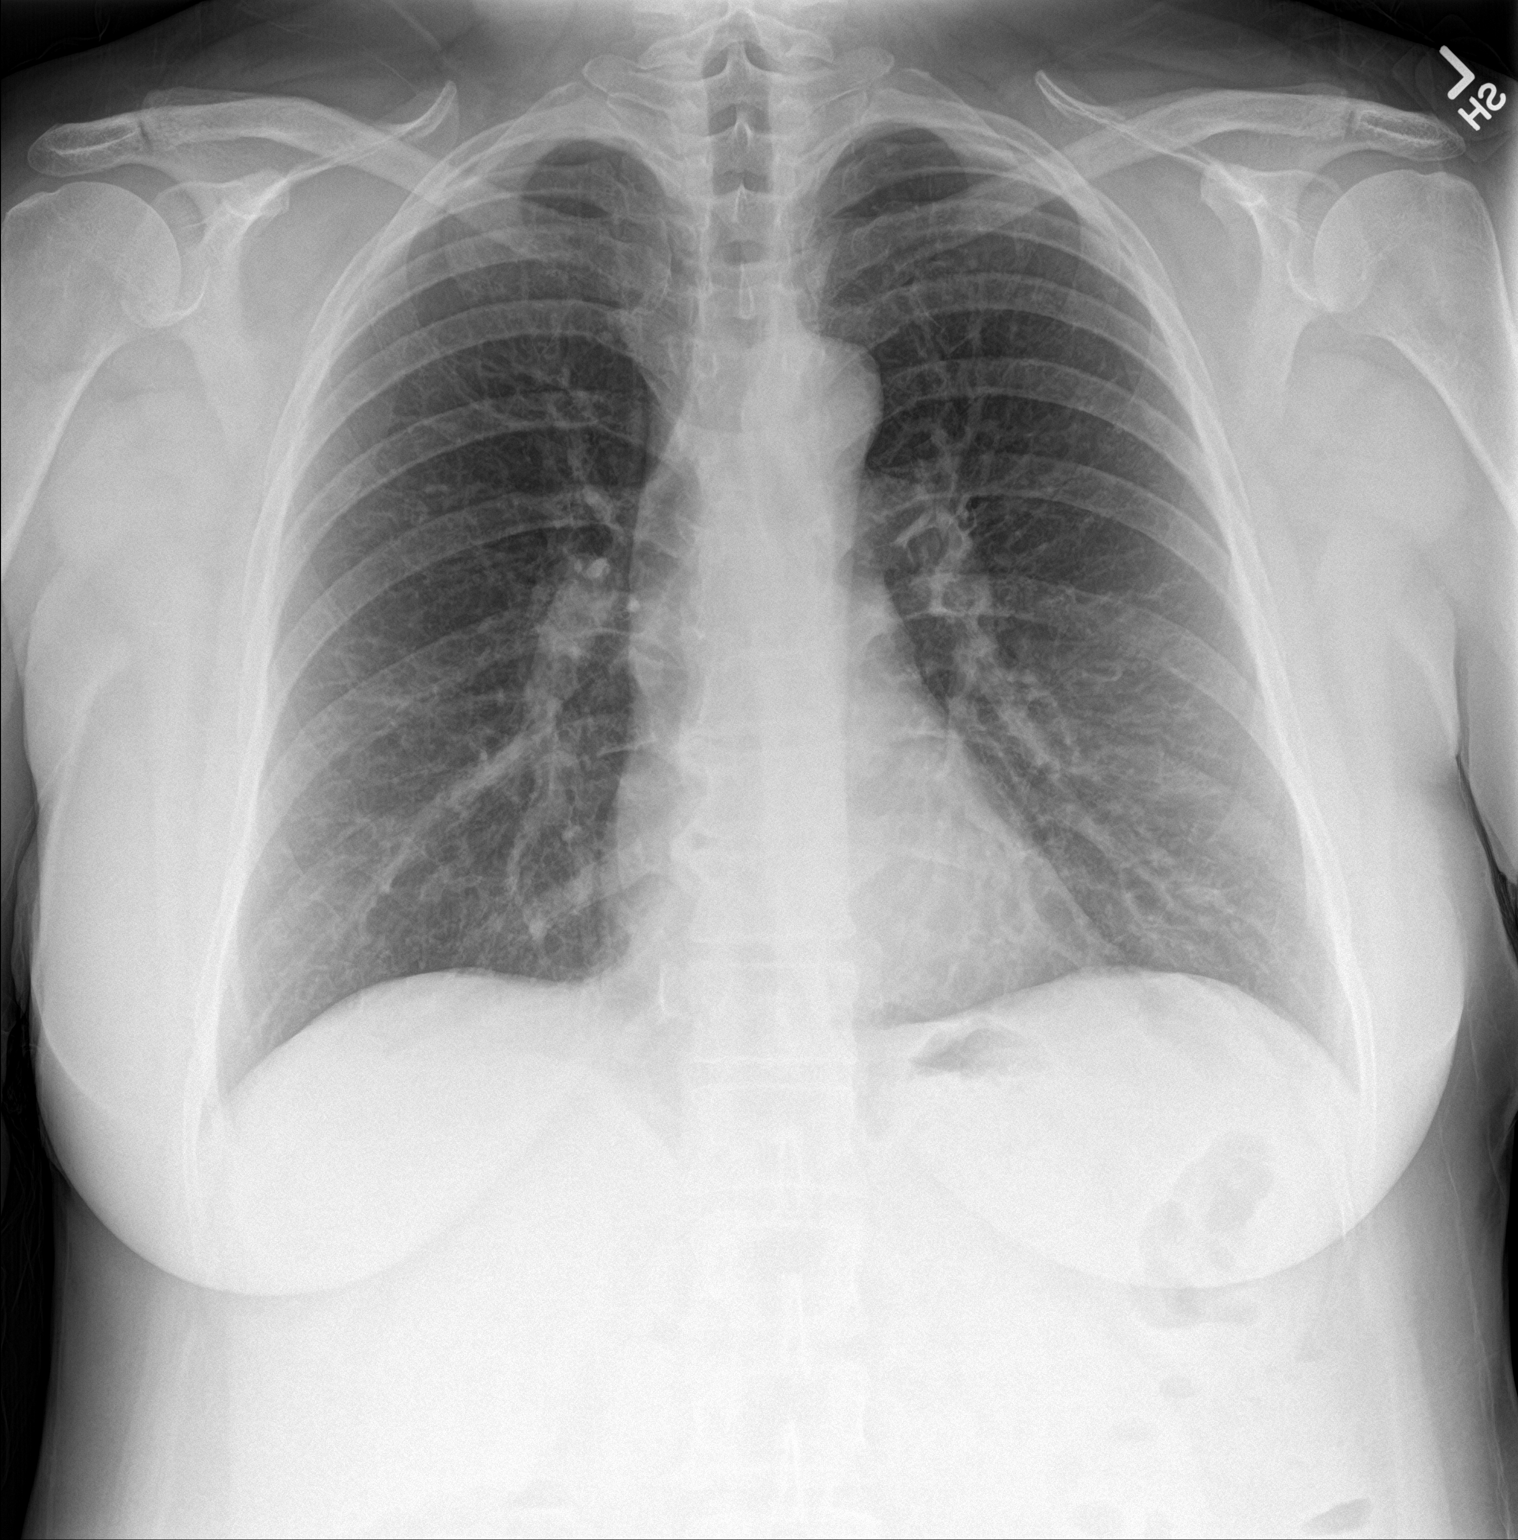

[chest lat]
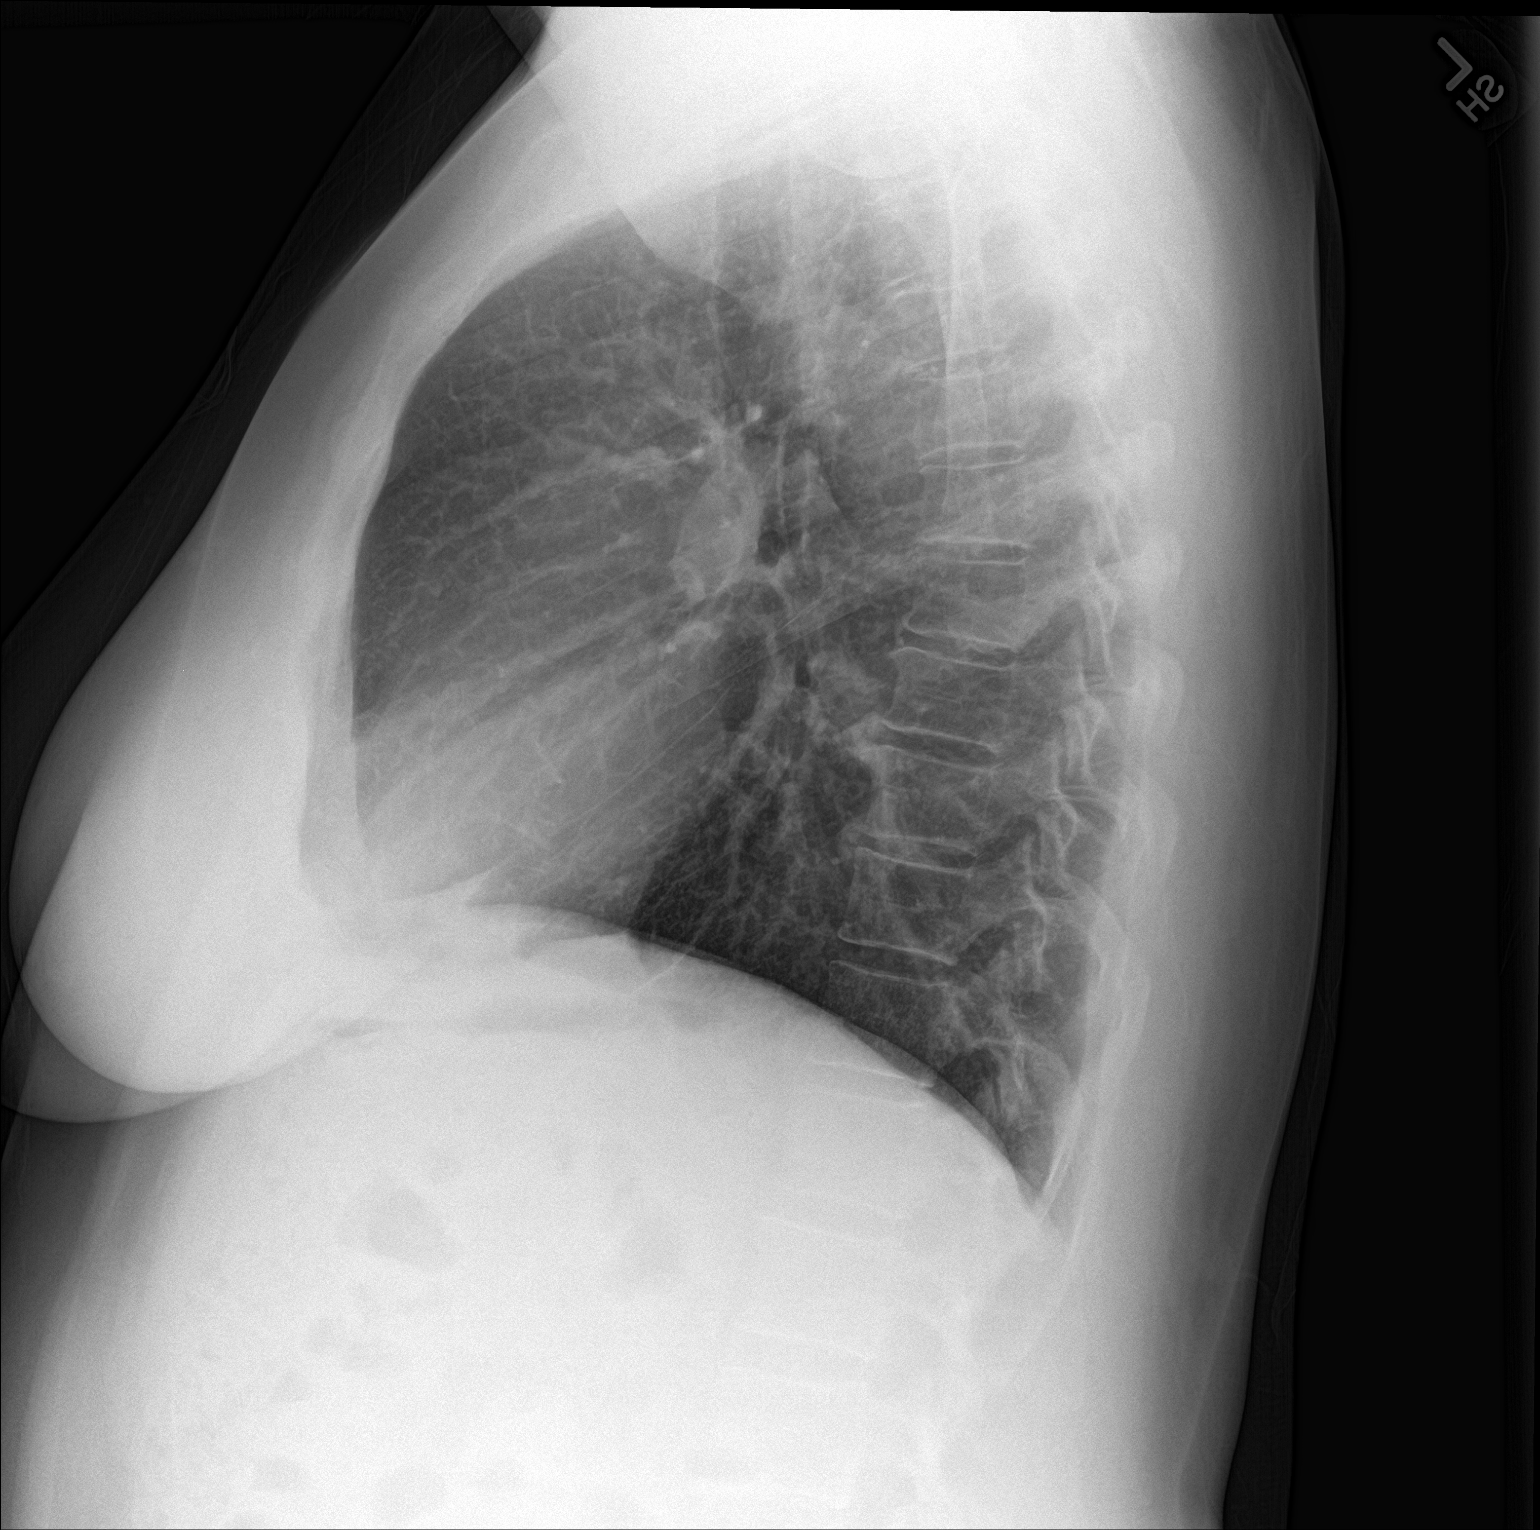

[2 of 2 positions shown; findings below may reference images not displayed]

FINDINGS: Normal lung volumes. Normal cardiac size and mediastinal contours.
Visualized tracheal air column is within normal limits. Lung
parenchyma appears stable and clear aside from perhaps mild
nonspecific chronic increased interstitial markings. Negative
visible bowel gas pattern. No acute osseous abnormality identified.
IMPRESSION: Negative.  No acute cardiopulmonary abnormality.

## 2021-12-06 ENCOUNTER — Emergency Department (HOSPITAL_BASED_OUTPATIENT_CLINIC_OR_DEPARTMENT_OTHER): Payer: Self-pay

## 2021-12-06 ENCOUNTER — Emergency Department (HOSPITAL_BASED_OUTPATIENT_CLINIC_OR_DEPARTMENT_OTHER)
Admission: EM | Admit: 2021-12-06 | Discharge: 2021-12-06 | Disposition: A | Discharge status: Home / Self Care | Payer: Self-pay | Attending: Emergency Medicine | Admitting: Emergency Medicine

## 2021-12-06 ENCOUNTER — Encounter (HOSPITAL_BASED_OUTPATIENT_CLINIC_OR_DEPARTMENT_OTHER): Payer: Self-pay | Admitting: Pediatrics

## 2021-12-06 ENCOUNTER — Other Ambulatory Visit: Payer: Self-pay

## 2021-12-06 DIAGNOSIS — I10 Essential (primary) hypertension: Secondary | ICD-10-CM | POA: Insufficient documentation

## 2021-12-06 DIAGNOSIS — M5412 Radiculopathy, cervical region: Secondary | ICD-10-CM | POA: Insufficient documentation

## 2021-12-06 LAB — CBG MONITORING, ED: Glucose-Capillary: 114 mg/dL — ABNORMAL HIGH (ref 70–99)

## 2021-12-06 LAB — DIFFERENTIAL
Abs Immature Granulocytes: 0.01 10*3/uL (ref 0.00–0.07)
Basophils Absolute: 0.1 10*3/uL (ref 0.0–0.1)
Basophils Relative: 1 %
Eosinophils Absolute: 0.2 10*3/uL (ref 0.0–0.5)
Eosinophils Relative: 3 %
Immature Granulocytes: 0 %
Lymphocytes Relative: 39 %
Lymphs Abs: 2.9 10*3/uL (ref 0.7–4.0)
Monocytes Absolute: 0.5 10*3/uL (ref 0.1–1.0)
Monocytes Relative: 6 %
Neutro Abs: 3.9 10*3/uL (ref 1.7–7.7)
Neutrophils Relative %: 51 %

## 2021-12-06 LAB — COMPREHENSIVE METABOLIC PANEL
ALT: 12 U/L (ref 0–44)
AST: 14 U/L — ABNORMAL LOW (ref 15–41)
Albumin: 4.1 g/dL (ref 3.5–5.0)
Alkaline Phosphatase: 96 U/L (ref 38–126)
Anion gap: 4 — ABNORMAL LOW (ref 5–15)
BUN: 15 mg/dL (ref 6–20)
CO2: 24 mmol/L (ref 22–32)
Calcium: 8.7 mg/dL — ABNORMAL LOW (ref 8.9–10.3)
Chloride: 109 mmol/L (ref 98–111)
Creatinine, Ser: 0.55 mg/dL (ref 0.44–1.00)
GFR, Estimated: >60 mL/min (ref >60)
Glucose, Bld: 97 mg/dL (ref 70–99)
Potassium: 3.5 mmol/L (ref 3.5–5.1)
Sodium: 137 mmol/L (ref 135–145)
Total Bilirubin: 0.9 mg/dL (ref 0.3–1.2)
Total Protein: 6.8 g/dL (ref 6.5–8.1)

## 2021-12-06 LAB — CBC
HCT: 39.8 % (ref 36.0–46.0)
Hemoglobin: 13.8 g/dL (ref 12.0–15.0)
MCH: 30 pg (ref 26.0–34.0)
MCHC: 34.7 g/dL (ref 30.0–36.0)
MCV: 86.5 fL (ref 80.0–100.0)
Platelets: 250 10*3/uL (ref 150–400)
RBC: 4.6 MIL/uL (ref 3.87–5.11)
RDW: 12.7 % (ref 11.5–15.5)
WBC: 7.5 10*3/uL (ref 4.0–10.5)
nRBC: 0 % (ref 0.0–0.2)

## 2021-12-06 MED ORDER — PREDNISONE 50 MG PO TABS: 50.0000 mg | ORAL_TABLET | Freq: Every day | ORAL | 0 refills | Status: AC

## 2021-12-06 MED ORDER — CYCLOBENZAPRINE HCL 10 MG PO TABS: 10.0000 mg | ORAL_TABLET | Freq: Two times a day (BID) | ORAL | 0 refills | Status: AC | PRN

## 2021-12-06 MED ORDER — NAPROXEN 375 MG PO TABS: 375.0000 mg | ORAL_TABLET | Freq: Two times a day (BID) | ORAL | 0 refills | Status: AC

## 2021-12-06 NOTE — Discharge Instructions (Addendum)
Take the medications as needed for your pain and discomfort.  Follow-up with your primary care doctor or consider seeing a spine doctor for further evaluation

## 2021-12-06 NOTE — ED Provider Notes (Signed)
[facility] EMERGENCY DEPARTMENT Provider Note   CSN: [id number] Arrival date & time: 12/06/21  1808   Spanish language translator used  History  Chief Complaint  Patient presents with   Numbness    Kaitlin Alvarez is a 56 y.o. female.  HPI   Patient has a history of hypertension.  She presents to the ED with complaints of numbness tingling and pain on the left side.  Patient states she started having pain on the left side of her head and neck and down her entire body since about 11 AM today.  She also feels a tingling sensation on the left side.  She is not having any trouble with weakness.  She denies any trouble walking.  She does not have any trouble with her speech or her vision.  No prior history of stroke.  Home Medications Prior to Admission medications   Medication Sig Start Date End Date Taking? Authorizing Provider  cyclobenzaprine (FLEXERIL) 10 MG tablet Take 1 tablet (10 mg total) by mouth 2 (two) times daily as needed for muscle spasms. 12/06/21  Yes Dorie Rank, MD  naproxen (NAPROSYN) 375 MG tablet Take 1 tablet (375 mg total) by mouth 2 (two) times daily. 12/06/21  Yes Dorie Rank, MD  predniSONE (DELTASONE) 50 MG tablet Take 1 tablet (50 mg total) by mouth daily. 12/06/21  Yes Dorie Rank, MD      Allergies    Patient has no known allergies.    Review of Systems   Review of Systems  Physical Exam Updated Vital Signs BP 123/76   Pulse 69   Temp 98.2 F (36.8 C) (Oral)   Resp 17   Ht 1.676 m (5\' 6" )   Wt 93.9 kg   SpO2 100%   BMI 33.41 kg/m  Physical Exam Vitals and nursing note reviewed.  Constitutional:      General: She is not in acute distress.    Appearance: She is well-developed.  HENT:     Head: Normocephalic and atraumatic.     Right Ear: External ear normal.     Left Ear: External ear normal.  Eyes:     General: No visual field deficit or scleral icterus.       Right eye: No discharge.        Left eye: No discharge.      Conjunctiva/sclera: Conjunctivae normal.  Neck:     Trachea: No tracheal deviation.     Comments: Mild tenderness palpation paraspinal region of the neck Cardiovascular:     Rate and Rhythm: Normal rate and regular rhythm.  Pulmonary:     Effort: Pulmonary effort is normal. No respiratory distress.     Breath sounds: Normal breath sounds. No stridor. No wheezing or rales.  Abdominal:     General: Bowel sounds are normal. There is no distension.     Palpations: Abdomen is soft.     Tenderness: There is no abdominal tenderness. There is no guarding or rebound.  Musculoskeletal:        General: No tenderness.     Cervical back: Neck supple.  Skin:    General: Skin is warm and dry.     Findings: No rash.  Neurological:     Mental Status: She is alert and oriented to person, place, and time.     Cranial Nerves: No cranial nerve deficit, dysarthria or facial asymmetry.     Sensory: No sensory deficit.     Motor: No abnormal muscle tone, seizure activity or  pronator drift.     Coordination: Coordination normal.     Comments:  able to hold both legs off bed for 5 seconds, sensation intact in all extremities,  no left or right sided neglect, normal finger-nose exam bilaterally, no nystagmus noted   Psychiatric:        Mood and Affect: Mood normal.     ED Results / Procedures / Treatments   Labs (all labs ordered are listed, but only abnormal results are displayed) Labs Reviewed  COMPREHENSIVE METABOLIC PANEL - Abnormal; Notable for the following components:      Result Value   Calcium 8.7 (*)    AST 14 (*)    Anion gap 4 (*)    All other components within normal limits  CBG MONITORING, ED - Abnormal; Notable for the following components:   Glucose-Capillary 114 (*)    All other components within normal limits  CBC  DIFFERENTIAL    EKG EKG Interpretation  Date/Time:  Monday December 06 2021 18:23:47 EDT Ventricular Rate:  70 PR Interval:  168 QRS Duration: 90 QT  Interval:  406 QTC Calculation: 438 R Axis:   62 Text Interpretation: Normal sinus rhythm Cannot rule out Anterior infarct , age undetermined Abnormal ECG When compared with ECG of 09-Jan-2017 13:52, No significant change since last tracing Confirmed by Dorie Rank [phone]) on 12/06/2021 6:34:46 PM  Radiology DG Cervical Spine Complete  Result Date: 12/06/2021 CLINICAL DATA:  Left-sided numbness, initial encounter EXAM: CERVICAL SPINE - COMPLETE 4+ VIEW COMPARISON:  None Available. FINDINGS: Seven cervical segments are well visualized. Vertebral body height is well maintained. Very mild osteophytic change is noted. No acute fracture or acute facet abnormality is noted. The odontoid is within normal limits. No neural foraminal narrowing is seen. IMPRESSION: Mild degenerative change without acute abnormality. Electronically Signed   By: Inez Catalina M.D.   On: 12/06/2021 19:42   CT HEAD WO CONTRAST  Result Date: 12/06/2021 CLINICAL DATA:  Left-sided arm and facial tingling, initial encounter EXAM: CT HEAD WITHOUT CONTRAST TECHNIQUE: Contiguous axial images were obtained from the base of the skull through the vertex without intravenous contrast. RADIATION DOSE REDUCTION: This exam was performed according to the departmental dose-optimization program which includes automated exposure control, adjustment of the mA and/or kV according to patient size and/or use of iterative reconstruction technique. COMPARISON:  None Available. FINDINGS: Brain: No evidence of acute infarction, hemorrhage, hydrocephalus, extra-axial collection or mass lesion/mass effect. Vascular: No hyperdense vessel or unexpected calcification. Skull: Normal. Negative for fracture or focal lesion. Sinuses/Orbits: No acute finding. Other: None. IMPRESSION: Acute intracranial abnormality noted. Electronically Signed   By: Inez Catalina M.D.   On: 12/06/2021 19:41    Procedures Procedures    Medications Ordered in ED Medications - No data  to display  ED Course/ Medical Decision Making/ A&P                           Medical Decision Making Frontal diagnosis includes but not limited to stroke, tumor, cervical radiculopathy  Problems Addressed: Cervical radiculopathy: acute illness or injury that poses a threat to life or bodily functions  Amount and/or Complexity of Data Reviewed Labs: ordered. Decision-making details documented in ED Course. Radiology: ordered and independent interpretation performed.  Risk Prescription drug management.   Presented to the ED for evaluation of tingling in the left side associated with pain and discomfort.  Consider the possibility of acute stroke however presentation is more  consistent with a cervical to colopathy.  Patient is having numbness and tingling but no focal neurologic deficit.  Her primary complaint is also pain and discomfort.  Normal neurologic exam.  Laboratory test and CT scan are reassuring.  Is not available at this facility but I do not feel there is a need for emergent transfer and testing at this time.  Will discharge home with prescriptions for prednisone muscle relaxants and naproxen.  Patient follow-up with PCP or spine doctor.        Final Clinical Impression(s) / ED Diagnoses Final diagnoses:  Cervical radiculopathy    Rx / DC Orders ED Discharge Orders          Ordered    predniSONE (DELTASONE) 50 MG tablet  Daily        12/06/21 2110    naproxen (NAPROSYN) 375 MG tablet  2 times daily        12/06/21 2110    cyclobenzaprine (FLEXERIL) 10 MG tablet  2 times daily PRN        12/06/21 2110              Dorie Rank, MD 12/06/21 2111

## 2021-12-06 NOTE — ED Triage Notes (Signed)
Reported numbness and tingling on left side of arm,leg and face around 11 am; denies any other symptoms; follows commands, A&Ox4; -facial droop, -slurred speech;

## 2023-03-14 ENCOUNTER — Other Ambulatory Visit: Payer: Self-pay

## 2023-03-14 ENCOUNTER — Emergency Department (HOSPITAL_BASED_OUTPATIENT_CLINIC_OR_DEPARTMENT_OTHER): Payer: Self-pay

## 2023-03-14 ENCOUNTER — Encounter (HOSPITAL_BASED_OUTPATIENT_CLINIC_OR_DEPARTMENT_OTHER): Payer: Self-pay | Admitting: Emergency Medicine

## 2023-03-14 ENCOUNTER — Emergency Department (HOSPITAL_BASED_OUTPATIENT_CLINIC_OR_DEPARTMENT_OTHER)
Admission: EM | Admit: 2023-03-14 | Discharge: 2023-03-14 | Disposition: A | Discharge status: Home / Self Care | Payer: Self-pay | Attending: Emergency Medicine | Admitting: Emergency Medicine

## 2023-03-14 DIAGNOSIS — R0602 Shortness of breath: Secondary | ICD-10-CM | POA: Insufficient documentation

## 2023-03-14 DIAGNOSIS — R0789 Other chest pain: Secondary | ICD-10-CM | POA: Insufficient documentation

## 2023-03-14 LAB — BASIC METABOLIC PANEL
Anion gap: 8 (ref 5–15)
BUN: 18 mg/dL (ref 6–20)
CO2: 26 mmol/L (ref 22–32)
Calcium: 8.7 mg/dL — ABNORMAL LOW (ref 8.9–10.3)
Chloride: 101 mmol/L (ref 98–111)
Creatinine, Ser: 0.76 mg/dL (ref 0.44–1.00)
GFR, Estimated: >60 mL/min (ref >60)
Glucose, Bld: 106 mg/dL — ABNORMAL HIGH (ref 70–99)
Potassium: 3.6 mmol/L (ref 3.5–5.1)
Sodium: 135 mmol/L (ref 135–145)

## 2023-03-14 LAB — TROPONIN I (HIGH SENSITIVITY): Troponin I (High Sensitivity): 2 ng/L (ref <18)

## 2023-03-14 LAB — CBC WITH DIFFERENTIAL/PLATELET
Abs Immature Granulocytes: 0.02 10*3/uL (ref 0.00–0.07)
Basophils Absolute: 0 10*3/uL (ref 0.0–0.1)
Basophils Relative: 1 %
Eosinophils Absolute: 0.2 10*3/uL (ref 0.0–0.5)
Eosinophils Relative: 3 %
HCT: 41.4 % (ref 36.0–46.0)
Hemoglobin: 14.3 g/dL (ref 12.0–15.0)
Immature Granulocytes: 0 %
Lymphocytes Relative: 39 %
Lymphs Abs: 2.7 10*3/uL (ref 0.7–4.0)
MCH: 29.2 pg (ref 26.0–34.0)
MCHC: 34.5 g/dL (ref 30.0–36.0)
MCV: 84.5 fL (ref 80.0–100.0)
Monocytes Absolute: 0.6 10*3/uL (ref 0.1–1.0)
Monocytes Relative: 9 %
Neutro Abs: 3.3 10*3/uL (ref 1.7–7.7)
Neutrophils Relative %: 48 %
Platelets: 275 10*3/uL (ref 150–400)
RBC: 4.9 MIL/uL (ref 3.87–5.11)
RDW: 12.2 % (ref 11.5–15.5)
WBC: 6.8 10*3/uL (ref 4.0–10.5)
nRBC: 0 % (ref 0.0–0.2)

## 2023-03-14 NOTE — Discharge Instructions (Addendum)
You were evaluated in the emergency room for chest pain.  Your lab work, EKG and chest x-ray were all unremarkable.  Your exam is most consistent with a musculoskeletal etiology such as shoulder tendinitis or costochondritis.  You were provided a referral for orthopedics.  Please call and make an appointment within the next 2 weeks.  I would additionally recommend you following up with your primary care doctor by the end of the week to ensure your symptoms are improving.   Lo evaluaron en la sala de emergencias por dolor en el pecho.  Tus anlisis de laboratorio, Materials engineer y radiografa de trax no tuvieron nada especial.  Su examen es ms consistente con una etiologa musculoesqueltica como tendinitis del hombro o costocondritis.  Se le proporcion una derivacin para ortopedia.  Llame y programe una cita dentro de las prximas 2 semanas.  Adems, le recomendara que consulte con su mdico de atencin primaria antes del final de la semana para asegurarse de que sus sntomas estn mejorando.

## 2023-03-14 NOTE — ED Triage Notes (Signed)
Per interpreter [phone]: Pt c/o LT side CP and SHOB x 1 wk; denies cough; describes pain as "small pokes like cramps"

## 2023-03-14 NOTE — ED Provider Notes (Signed)
[facility] Provider Note   CSN: [id number] Arrival date & time: 03/14/23  1932     History  Chief Complaint  Patient presents with   Chest Pain    Kaitlin Alvarez is a 58 y.o. female who presents with chest pain and shortness of breath over the past week.  Patient states symptoms are intermittent.  Her symptoms appear to be worse with certain movements.  Does not feel short of breath at rest or with exertion. She describes pain worse on her left side and into her shoulder.  No cardiac history or history of blood clots.  Denies any cough or congestion.  No fever no one is sick at home. Patient is Spanish-speaking, exam performed via interpretation.  Chest Pain      Home Medications Prior to Admission medications   Medication Sig Start Date End Date Taking? Authorizing Provider  cyclobenzaprine (FLEXERIL) 10 MG tablet Take 1 tablet (10 mg total) by mouth 2 (two) times daily as needed for muscle spasms. 12/06/21   Linwood Dibbles, MD  naproxen (NAPROSYN) 375 MG tablet Take 1 tablet (375 mg total) by mouth 2 (two) times daily. 12/06/21   Linwood Dibbles, MD  predniSONE (DELTASONE) 50 MG tablet Take 1 tablet (50 mg total) by mouth daily. 12/06/21   Linwood Dibbles, MD      Allergies    Patient has no known allergies.    Review of Systems   Review of Systems  Cardiovascular:  Positive for chest pain.    Physical Exam Updated Vital Signs BP 131/83 (BP Location: Right Arm)   Pulse 91   Temp 98.3 F (36.8 C)   Resp 18   Ht 5\' 6"  (1.676 m)   Wt 90.7 kg   LMP 10/09/2015   SpO2 100%   BMI 32.28 kg/m  Physical Exam Vitals and nursing note reviewed.  Constitutional:      General: She is not in acute distress.    Appearance: She is well-developed.  HENT:     Head: Normocephalic and atraumatic.  Eyes:     Conjunctiva/sclera: Conjunctivae normal.  Cardiovascular:     Rate and Rhythm: Normal rate and regular rhythm.     Heart sounds: No  murmur heard. Pulmonary:     Effort: Pulmonary effort is normal. No respiratory distress.     Breath sounds: Normal breath sounds.  Abdominal:     Palpations: Abdomen is soft.     Tenderness: There is no abdominal tenderness.  Musculoskeletal:        General: No swelling.     Cervical back: Neck supple.     Comments: Patient with notable reproducible left-sided chest tenderness and generalized left shoulder tenderness, pain is worse with left shoulder range of motion, she is 5 out of 5 strength and full range of motion, positive Hawkins, positive Neer's  Skin:    General: Skin is warm and dry.     Capillary Refill: Capillary refill takes less than 2 seconds.  Neurological:     Mental Status: She is alert.  Psychiatric:        Mood and Affect: Mood normal.     ED Results / Procedures / Treatments   Labs (all labs ordered are listed, but only abnormal results are displayed) Labs Reviewed  BASIC METABOLIC PANEL - Abnormal; Notable for the following components:      Result Value   Glucose, Bld 106 (*)    Calcium 8.7 (*)  All other components within normal limits  CBC WITH DIFFERENTIAL/PLATELET  TROPONIN I (HIGH SENSITIVITY)    EKG None  Radiology DG Chest 2 View Result Date: 03/14/2023 CLINICAL DATA:  LT side CP and SHOB x 1 wk; denies cough; describes pain as "small pokes like cramps. EXAM: CHEST - 2 VIEW COMPARISON:  Chest x-ray 01/09/2017, CT chest 11/09/2015 FINDINGS: The heart and mediastinal contours are within normal limits. No focal consolidation. No pulmonary edema. No pleural effusion. No pneumothorax. No acute osseous abnormality. IMPRESSION: No active cardiopulmonary disease. Electronically Signed   By: Tish Frederickson M.D.   On: 03/14/2023 20:03    Procedures Procedures    Medications Ordered in ED Medications - No data to display  ED Course/ Medical Decision Making/ A&P                                 Medical Decision Making Amount and/or Complexity  of Data Reviewed Labs: ordered. Radiology: ordered.   This patient presents to the ED with chief complaint(s) of chest pain.  The complaint involves an extensive differential diagnosis and also carries with it a high risk of complications and morbidity.   pertinent past medical history as listed in HPI  The differential diagnosis includes  ACS, PE, aortic dissection, pneumonia, pneumothorax, myocarditis, pericarditis, musculoskeletal, GERD, esophageal dissection The initial plan is to  Will start with basic labs, EKG, chest x-ray Additional history obtained: Records reviewed previous admission documents and Care Everywhere/External Records  Initial Assessment:   Hemodynamically stable patient presenting with chest pain x 1 week.  On exam she has notable reproducible tenderness to the left chest and left shoulder.  Pain is worse with left shoulder range of motion, torso twisting and impingement provocative maneuvers.  She has no cardiac history or history of blood clots.  There is no exertional component patient appears comfortable no acute distress, she is laughing during exam.  She is not hypoxic, tachycardic or tachypneic.  No recent surgery or any risk factors for PE.  Overall low suspicion for ACS, PE, dissection, pneumonia, pneumothorax.  Exam and history are most suspicious for musculoskeletal etiology.  Independent ECG interpretation:  Normal sinus rhythm without ischemic changes  Independent labs interpretation:  The following labs were independently interpreted:  CBC unremarkable, BMP without significant abnormality, troponin without elevation  Independent visualization and interpretation of imaging: I independently visualized the following imaging with scope of interpretation limited to determining acute life threatening conditions related to emergency care: Chest x-ray, which revealed no cardio pulmonary disease  Treatment and Reassessment: No medications administered during  visit  Consultations obtained:   None  Disposition:   Patient will be discharged home.  Provided referral for orthopedics.  Encouraged to use Tylenol and ibuprofen for pain. The patient has been appropriately medically screened and/or stabilized in the ED. I have low suspicion for any other emergent medical condition which would require further screening, evaluation or treatment in the ED or require inpatient management. At time of discharge the patient is hemodynamically stable and in no acute distress. I have discussed work-up results and diagnosis with patient and answered all questions. Patient is agreeable with discharge plan. We discussed strict return precautions for returning to the emergency department and they verbalized understanding.     Social Determinants of Health:   none  This note was dictated with voice recognition software.  Despite best efforts at proofreading, errors may have occurred which  can change the documentation meaning.          Final Clinical Impression(s) / ED Diagnoses Final diagnoses:  Atypical chest pain    Rx / DC Orders ED Discharge Orders     None         Fabienne Bruns 03/14/23 2203    Derwood Kaplan, MD 03/17/23 1725
